# Patient Record
Sex: Male | Born: 1955 | Hispanic: Refuse to answer | Marital: Married | Smoking: Current every day smoker
Health system: Southern US, Community
[De-identification: ages and names within clinical notes are randomized; demographics above are authoritative.]

## PROBLEM LIST (undated history)

## (undated) DIAGNOSIS — K219 Gastro-esophageal reflux disease without esophagitis: Secondary | ICD-10-CM

## (undated) DIAGNOSIS — J449 Chronic obstructive pulmonary disease, unspecified: Secondary | ICD-10-CM

## (undated) DIAGNOSIS — F1021 Alcohol dependence, in remission: Secondary | ICD-10-CM

## (undated) DIAGNOSIS — J410 Simple chronic bronchitis: Secondary | ICD-10-CM

## (undated) DIAGNOSIS — Z973 Presence of spectacles and contact lenses: Secondary | ICD-10-CM

## (undated) DIAGNOSIS — C679 Malignant neoplasm of bladder, unspecified: Secondary | ICD-10-CM

## (undated) DIAGNOSIS — Z862 Personal history of diseases of the blood and blood-forming organs and certain disorders involving the immune mechanism: Secondary | ICD-10-CM

## (undated) DIAGNOSIS — D494 Neoplasm of unspecified behavior of bladder: Secondary | ICD-10-CM

## (undated) DIAGNOSIS — C6290 Malignant neoplasm of unspecified testis, unspecified whether descended or undescended: Secondary | ICD-10-CM

## (undated) DIAGNOSIS — I499 Cardiac arrhythmia, unspecified: Secondary | ICD-10-CM

## (undated) DIAGNOSIS — R011 Cardiac murmur, unspecified: Secondary | ICD-10-CM

## (undated) DIAGNOSIS — H40003 Preglaucoma, unspecified, bilateral: Secondary | ICD-10-CM

## (undated) DIAGNOSIS — Z87898 Personal history of other specified conditions: Secondary | ICD-10-CM

## (undated) HISTORY — PX: ORCHIOPEXY: SUR915

## (undated) HISTORY — PX: RADICAL ORCHIECTOMY: SHX2285

## (undated) HISTORY — PX: TESTICLE REMOVAL: SHX68

## (undated) HISTORY — PX: SURGERY SCROTAL / TESTICULAR: SUR1316

## (undated) HISTORY — DX: Cardiac arrhythmia, unspecified: I49.9

## (undated) HISTORY — PX: MOUTH SURGERY: SHX715

---

## 1982-10-11 DIAGNOSIS — Z87820 Personal history of traumatic brain injury: Secondary | ICD-10-CM

## 1982-10-11 HISTORY — DX: Personal history of traumatic brain injury: Z87.820

## 1999-10-12 DIAGNOSIS — Z9221 Personal history of antineoplastic chemotherapy: Secondary | ICD-10-CM

## 1999-10-12 DIAGNOSIS — Z8547 Personal history of malignant neoplasm of testis: Secondary | ICD-10-CM

## 1999-10-12 HISTORY — DX: Personal history of antineoplastic chemotherapy: Z92.21

## 1999-10-12 HISTORY — DX: Personal history of malignant neoplasm of testis: Z85.47

## 1999-12-06 ENCOUNTER — Ambulatory Visit (HOSPITAL_COMMUNITY): Admission: RE | Admit: 1999-12-06 | Discharge: 1999-12-06 | Payer: Self-pay | Admitting: Internal Medicine

## 1999-12-06 ENCOUNTER — Emergency Department (HOSPITAL_COMMUNITY): Admission: EM | Admit: 1999-12-06 | Discharge: 1999-12-06 | Payer: Self-pay | Admitting: Emergency Medicine

## 1999-12-06 ENCOUNTER — Encounter: Payer: Self-pay | Admitting: Internal Medicine

## 1999-12-14 ENCOUNTER — Ambulatory Visit (HOSPITAL_COMMUNITY): Admission: RE | Admit: 1999-12-14 | Discharge: 1999-12-14 | Payer: Self-pay | Admitting: *Deleted

## 1999-12-14 ENCOUNTER — Encounter: Payer: Self-pay | Admitting: *Deleted

## 1999-12-15 ENCOUNTER — Encounter: Payer: Self-pay | Admitting: Urology

## 1999-12-16 ENCOUNTER — Inpatient Hospital Stay (HOSPITAL_COMMUNITY): Admission: RE | Admit: 1999-12-16 | Discharge: 1999-12-17 | Payer: Self-pay | Admitting: Urology

## 1999-12-16 ENCOUNTER — Encounter (INDEPENDENT_AMBULATORY_CARE_PROVIDER_SITE_OTHER): Payer: Self-pay | Admitting: *Deleted

## 1999-12-22 ENCOUNTER — Encounter (HOSPITAL_COMMUNITY): Admission: RE | Admit: 1999-12-22 | Discharge: 2000-03-21 | Payer: Self-pay | Admitting: Dentistry

## 1999-12-27 ENCOUNTER — Inpatient Hospital Stay (HOSPITAL_COMMUNITY): Admission: RE | Admit: 1999-12-27 | Discharge: 2000-01-02 | Payer: Self-pay | Admitting: General Surgery

## 1999-12-28 ENCOUNTER — Encounter (HOSPITAL_BASED_OUTPATIENT_CLINIC_OR_DEPARTMENT_OTHER): Payer: Self-pay | Admitting: General Surgery

## 1999-12-28 ENCOUNTER — Encounter: Payer: Self-pay | Admitting: *Deleted

## 2000-01-17 ENCOUNTER — Inpatient Hospital Stay (HOSPITAL_COMMUNITY): Admission: EM | Admit: 2000-01-17 | Discharge: 2000-01-22 | Payer: Self-pay | Admitting: *Deleted

## 2000-02-07 ENCOUNTER — Inpatient Hospital Stay (HOSPITAL_COMMUNITY): Admission: AD | Admit: 2000-02-07 | Discharge: 2000-02-12 | Payer: Self-pay | Admitting: *Deleted

## 2000-03-15 ENCOUNTER — Ambulatory Visit (HOSPITAL_COMMUNITY): Admission: RE | Admit: 2000-03-15 | Discharge: 2000-03-15 | Payer: Self-pay | Admitting: General Surgery

## 2000-03-28 ENCOUNTER — Ambulatory Visit (HOSPITAL_COMMUNITY): Admission: RE | Admit: 2000-03-28 | Discharge: 2000-03-28 | Payer: Self-pay | Admitting: *Deleted

## 2000-03-28 ENCOUNTER — Encounter: Payer: Self-pay | Admitting: *Deleted

## 2000-04-26 ENCOUNTER — Encounter: Payer: Self-pay | Admitting: *Deleted

## 2000-04-26 ENCOUNTER — Encounter: Admission: RE | Admit: 2000-04-26 | Discharge: 2000-04-26 | Payer: Self-pay | Admitting: *Deleted

## 2000-05-11 ENCOUNTER — Encounter: Payer: Self-pay | Admitting: *Deleted

## 2000-05-11 ENCOUNTER — Ambulatory Visit (HOSPITAL_COMMUNITY): Admission: RE | Admit: 2000-05-11 | Discharge: 2000-05-11 | Payer: Self-pay | Admitting: *Deleted

## 2000-06-22 ENCOUNTER — Encounter: Admission: RE | Admit: 2000-06-22 | Discharge: 2000-06-22 | Payer: Self-pay | Admitting: Urology

## 2000-06-22 ENCOUNTER — Encounter: Payer: Self-pay | Admitting: Urology

## 2000-07-04 ENCOUNTER — Encounter: Payer: Self-pay | Admitting: *Deleted

## 2000-07-04 ENCOUNTER — Encounter: Admission: RE | Admit: 2000-07-04 | Discharge: 2000-07-04 | Payer: Self-pay | Admitting: *Deleted

## 2000-08-01 ENCOUNTER — Encounter: Payer: Self-pay | Admitting: *Deleted

## 2000-08-01 ENCOUNTER — Ambulatory Visit (HOSPITAL_COMMUNITY): Admission: RE | Admit: 2000-08-01 | Discharge: 2000-08-01 | Payer: Self-pay | Admitting: *Deleted

## 2000-10-13 ENCOUNTER — Encounter: Payer: Self-pay | Admitting: *Deleted

## 2000-10-13 ENCOUNTER — Ambulatory Visit (HOSPITAL_COMMUNITY): Admission: RE | Admit: 2000-10-13 | Discharge: 2000-10-13 | Payer: Self-pay | Admitting: *Deleted

## 2000-12-15 ENCOUNTER — Encounter: Payer: Self-pay | Admitting: *Deleted

## 2000-12-15 ENCOUNTER — Encounter: Admission: RE | Admit: 2000-12-15 | Discharge: 2000-12-15 | Payer: Self-pay | Admitting: *Deleted

## 2001-01-17 ENCOUNTER — Ambulatory Visit (HOSPITAL_COMMUNITY): Admission: RE | Admit: 2001-01-17 | Discharge: 2001-01-17 | Payer: Self-pay | Admitting: *Deleted

## 2001-01-17 ENCOUNTER — Encounter: Payer: Self-pay | Admitting: *Deleted

## 2001-02-17 ENCOUNTER — Encounter: Admission: RE | Admit: 2001-02-17 | Discharge: 2001-02-17 | Payer: Self-pay | Admitting: *Deleted

## 2001-02-17 ENCOUNTER — Encounter: Payer: Self-pay | Admitting: *Deleted

## 2001-07-03 ENCOUNTER — Ambulatory Visit (HOSPITAL_COMMUNITY): Admission: RE | Admit: 2001-07-03 | Discharge: 2001-07-03 | Payer: Self-pay | Admitting: Oncology

## 2001-07-03 ENCOUNTER — Encounter: Payer: Self-pay | Admitting: Oncology

## 2001-10-05 ENCOUNTER — Encounter: Payer: Self-pay | Admitting: Urology

## 2001-10-05 ENCOUNTER — Ambulatory Visit (HOSPITAL_COMMUNITY): Admission: RE | Admit: 2001-10-05 | Discharge: 2001-10-05 | Payer: Self-pay | Admitting: Urology

## 2003-04-19 ENCOUNTER — Encounter: Payer: Self-pay | Admitting: Oncology

## 2003-04-19 ENCOUNTER — Encounter: Admission: RE | Admit: 2003-04-19 | Discharge: 2003-04-19 | Payer: Self-pay | Admitting: Oncology

## 2004-03-30 ENCOUNTER — Encounter: Admission: RE | Admit: 2004-03-30 | Discharge: 2004-03-30 | Payer: Self-pay | Admitting: Oncology

## 2004-08-22 ENCOUNTER — Ambulatory Visit: Payer: Self-pay | Admitting: Oncology

## 2005-02-19 ENCOUNTER — Ambulatory Visit: Payer: Self-pay | Admitting: Oncology

## 2005-02-22 ENCOUNTER — Ambulatory Visit (HOSPITAL_COMMUNITY): Admission: RE | Admit: 2005-02-22 | Discharge: 2005-02-22 | Payer: Self-pay | Admitting: Oncology

## 2005-02-22 ENCOUNTER — Ambulatory Visit: Payer: Self-pay | Admitting: Oncology

## 2007-02-23 ENCOUNTER — Ambulatory Visit: Payer: Self-pay | Admitting: Oncology

## 2007-02-27 ENCOUNTER — Ambulatory Visit (HOSPITAL_COMMUNITY): Admission: RE | Admit: 2007-02-27 | Discharge: 2007-02-27 | Payer: Self-pay | Admitting: Oncology

## 2007-03-01 LAB — BETA HCG QUANT (REF LAB): Beta hCG, Tumor Marker: <0.5 m[IU]/mL (ref <5.0)

## 2008-02-22 ENCOUNTER — Ambulatory Visit: Payer: Self-pay | Admitting: Oncology

## 2008-04-24 ENCOUNTER — Ambulatory Visit: Payer: Self-pay | Admitting: Oncology

## 2008-05-01 LAB — BETA HCG QUANT (REF LAB): Beta hCG, Tumor Marker: <0.5 m[IU]/mL (ref <5.0)

## 2009-05-23 ENCOUNTER — Ambulatory Visit: Payer: Self-pay | Admitting: Oncology

## 2009-05-29 LAB — BETA HCG QUANT (REF LAB): Beta hCG, Tumor Marker: <0.5 m[IU]/mL (ref <5.0)

## 2009-08-22 ENCOUNTER — Ambulatory Visit: Payer: Self-pay | Admitting: Oncology

## 2010-01-23 ENCOUNTER — Ambulatory Visit: Payer: Self-pay | Admitting: Oncology

## 2010-11-02 ENCOUNTER — Encounter: Payer: Self-pay | Admitting: Oncology

## 2011-02-04 ENCOUNTER — Other Ambulatory Visit: Payer: Self-pay | Admitting: Oncology

## 2011-02-04 ENCOUNTER — Encounter (HOSPITAL_BASED_OUTPATIENT_CLINIC_OR_DEPARTMENT_OTHER): Payer: Self-pay | Admitting: Oncology

## 2011-02-04 DIAGNOSIS — Q539 Undescended testicle, unspecified: Secondary | ICD-10-CM

## 2011-02-04 DIAGNOSIS — R55 Syncope and collapse: Secondary | ICD-10-CM

## 2011-02-04 DIAGNOSIS — C6290 Malignant neoplasm of unspecified testis, unspecified whether descended or undescended: Secondary | ICD-10-CM

## 2011-02-04 DIAGNOSIS — R031 Nonspecific low blood-pressure reading: Secondary | ICD-10-CM
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
## 2011-02-26 NOTE — Op Note (Signed)
[place]  Patient:    [name], Jonathan Ray                       MRN: [id number] Proc. Date: 12/28/99 Adm. Date:  [id number] Attending:  Craig Staggers CC:         Mardene Celeste Lurene Shadow, M.D. (2)                           Operative Report  PREOPERATIVE DIAGNOSIS:  Carcinoma of the testicle and poor venous access.  POSTOPERATIVE DIAGNOSIS:  Carcinoma of the testicle and poor venous access.  OPERATION PERFORMED:  Port-A-Cath implantation to facilitate chemotherapy.  SURGEON:  Mardene Celeste. Lurene Shadow, M.D.  ASSISTANT:  Nurse.  ANESTHESIA:  MAC.  I used 1% Xylocaine with epinephrine.  INDICATIONS FOR PROCEDURE:   The patient is a 55 year old man who was brought to the operating room for implantation of a Port-A-Cath to facilitate chemotherapy for testicular carcinoma.  He is otherwise in excellent health.  DESCRIPTION OF PROCEDURE:  Following induction of satisfactory sedation, the patient was placed in Trendelenburg position.  The anterior neck and chest were  prepped and draped to be included in a sterile operative field.  A subclavian stick made in the right subclavian vein and this was carried out and a guide wire inserted and manipulated into the superior vena cava.  The pocket was then created on the anterior chest wall and a tunnel from the pocket up to the shoulder wound. At the point of which we tried to insert the dilator introducer.  The dilator would not progress because of its very close proximity to the clavicle on the left appears to be a cervical rib.  This approach was then abandoned and we then went up to the internal jugular where the internal jugular vein was accessed and a guide wire threaded into the central venous system under fluoroscopic guidance.  The Silastic catheter was then threaded up above the clavicle to the jugular vein. The introducer and dilator passed down into the superior vena cava without difficulty. The  dilator was removed and the Silastic catheter then threaded into the central venous system and positioned at the atrial vena caval junction.  Inflow of heparinized saline  and blood return were noted to be excellent.  The external portion of the catheter was then attached to the reservoir and inflow of heparinized saline and blood return through the reservoir were noted to be excellent.  The reservoir was then implanted into the pocket and sutured with 2-0 silk sutures.  Final fluoroscopic evaluation of the implantation showed no unusual kinks bends or turns within the course of the Silastic catheter and inflow of heparinized saline and blood return were all noted to be excellent.  Sponge, instrument and sharp counts were verified and all wounds were closed in layers s follows.  The neck wound, shoulder wound and pocket wound were closed in two layers with interrupted Vicryl sutures in the subcutaneous tissues and 4-0 Monocryl sutures of the skin.  All wounds were then reinforced with Steri-Strips. Sterile dressings applied.  Anesthetic reversed.  Patient removed from the operating room to the recovery room in stable condition having tolerated the procedure well. DD:  12/28/99 TD:  12/28/99 Job: [id number]

## 2011-02-26 NOTE — Discharge Summary (Signed)
[place]. [facility]  Patient:    Jonathan Ray, Jonathan Ray                       MRN: [id number] Proc. Date: 02/12/00 Adm. Date:  [id number] Disc. Date: [id number] Attending:  Craig Staggers CC:         Bertram Millard. Dahlstedt, M.D.             Mardene Celeste Lurene Shadow, M.D.                           Discharge Summary  DATE OF BIRTH:  [birth year]  DIAGNOSES: 1. 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 2. Anemia secondary to cancer and chemotherapy. 3. Protein malnutrition secondary to cancer and chemotherapy. 4. Minimal edema, right upper extremity, suspect secondary to Port-A-Cath. 5. Port-A-Cath, right subclavian.  FOLLOWUP:  Followup appointment, May 8 and May 15 in the office of Dr. Nevada Crane for chemotherapy with bleomycin.  CONDITION: Stable.  PROCEDURES: None.  CONSULTATIONS: None.  COMPLICATIONS: None.  DISPOSITION: Home.  SPECIAL INSTRUCTIONS: Neutropenic precautions.  Call with any problems.  HISTORY OF PRESENT ILLNESS:  Jonathan Ray is a 55 year old man.  He had bilateral undescended testes.  He underwent reparative surgery at age 27 for the right testicle and at age 32 for the left one.  He developed a seminoma in the left testicle and it was stage II.  The primary tumor was resected.  His lymph nodes were up to 6 cm in the pelvis.  He had Ray lymph nodes above the ______.  He received two courses of BEP with minimal side effects and was admitted electively on April 29 for is third and last course of BEP.  He was given G-CSF for a few days prior to admission for the neutrophils to be more than 1000 (during the previous cycle his neutrophils were below 1000 on day #1).  HOSPITAL COURSE: On admission he was in Ray distress.  Blood pressure 120/70, heart rate 69, respirations 17, temperature 98.  There was Ray mucocitis.  His chest was clear.  Heart was regular.   Abdomen is soft, benign.  The midline scar looked normal.  There was Ray organomegaly.  Ray masses.  His laboratory results indicated a beta hCG less than 2 on February 08, 2000. His white count was 26.7, hemoglobin 11.6, platelets 136.  Sodium 139, BUN 12, creatinine 0.8, albumin 3.1.  LDH 321.  ALT 16, AST 18.  He was admitted to a regular nursing floor and chemotherapy with bleomycin, etoposide, cisplatin was instituted.  The patient tolerated chemotherapy well with minimal nausea on the last day and the metallic taste.  He had Ray other side effects.  Of note, minimal edema of the right upper extremity was noticed on admission. Again, it was minimal and collateral circulation did not progress and it was attributed possibly to impingement on the venous return by the Port-A-Cath.  The plan is to restage Jonathan Ray with a CAT scan of his chest, abdomen and pelvis around three weeks after commencement of his last course, and should he have Ray evidence of disease, the Port-A-Cath will be removed.  At any rate, Jonathan Ray will follow up in my office on May 8 and also on May 15.  Regarding his present ______ that he is also at risk for ______  and needs to be followed with periodic ultrasound or alternatively orchiectomy. The patient was seen by Dr. Retta Diones and the ______ decision was made for periodic followup with ultrasound. DD:  02/12/00 TD:  02/16/00 Job: [id number]

## 2011-03-04 ENCOUNTER — Ambulatory Visit (HOSPITAL_COMMUNITY)
Admission: RE | Admit: 2011-03-04 | Discharge: 2011-03-04 | Disposition: A | Discharge status: Home / Self Care | Payer: Self-pay | Source: Ambulatory Visit | Attending: Oncology | Admitting: Oncology

## 2011-03-04 ENCOUNTER — Other Ambulatory Visit: Payer: Self-pay | Admitting: Oncology

## 2011-03-04 DIAGNOSIS — C6290 Malignant neoplasm of unspecified testis, unspecified whether descended or undescended: Secondary | ICD-10-CM

## 2011-03-04 DIAGNOSIS — F17200 Nicotine dependence, unspecified, uncomplicated: Secondary | ICD-10-CM | POA: Insufficient documentation

## 2011-03-04 DIAGNOSIS — Q539 Undescended testicle, unspecified: Secondary | ICD-10-CM | POA: Insufficient documentation

## 2011-03-04 DIAGNOSIS — R059 Cough, unspecified: Secondary | ICD-10-CM | POA: Insufficient documentation

## 2011-03-04 DIAGNOSIS — Z8547 Personal history of malignant neoplasm of testis: Secondary | ICD-10-CM | POA: Insufficient documentation

## 2011-03-04 DIAGNOSIS — R05 Cough: Secondary | ICD-10-CM | POA: Insufficient documentation

## 2011-03-31 ENCOUNTER — Other Ambulatory Visit: Payer: Self-pay | Admitting: Oncology

## 2011-03-31 DIAGNOSIS — C6290 Malignant neoplasm of unspecified testis, unspecified whether descended or undescended: Secondary | ICD-10-CM

## 2011-08-20 ENCOUNTER — Other Ambulatory Visit (HOSPITAL_COMMUNITY): Payer: Self-pay

## 2011-08-28 ENCOUNTER — Telehealth: Payer: Self-pay | Admitting: Oncology

## 2011-08-28 NOTE — Telephone Encounter (Signed)
Mailed the pt his April 2013 appt calendar

## 2012-02-03 ENCOUNTER — Ambulatory Visit: Payer: Self-pay | Admitting: Oncology

## 2012-02-03 ENCOUNTER — Other Ambulatory Visit: Payer: Self-pay | Admitting: *Deleted

## 2016-01-14 ENCOUNTER — Ambulatory Visit (INDEPENDENT_AMBULATORY_CARE_PROVIDER_SITE_OTHER): Payer: Self-pay | Admitting: Urgent Care

## 2016-01-14 VITALS — BP 132/82 | HR 70 | Temp 97.8°F | Resp 17 | Ht 74.0 in | Wt 187.0 lb

## 2016-01-14 DIAGNOSIS — Z021 Encounter for pre-employment examination: Secondary | ICD-10-CM

## 2016-01-14 DIAGNOSIS — Z024 Encounter for examination for driving license: Secondary | ICD-10-CM

## 2016-01-14 NOTE — Patient Instructions (Signed)
     IF you received an x-ray today, you will receive an invoice from [facility]. Please contact [facility] at [phone] with questions or concerns regarding your invoice.   IF you received labwork today, you will receive an invoice from [facility]. Please contact [facility] at [phone] with questions or concerns regarding your invoice.   Our billing staff will not be able to assist you with questions regarding bills from these companies.  You will be contacted with the lab results as soon as they are available. The fastest way to get your results is to activate your My Chart account. Instructions are located on the last page of this paperwork. If you have not heard from us regarding the results in 2 weeks, please contact this office.      

## 2016-01-14 NOTE — Progress Notes (Signed)
Commercial Driver Medical Examination   Jonathan Ray is a 60 y.o. male who presents today for a DOT physical exam. The patient reports smoking 1-2ppd for the past 40 years. Denies chest pain, shob, wheezing. Had gum disease surgically repaired, tooth extraction 06-21-2015, without sequelae. He also has a history of testicular cancer last treated in 2001. Had undescended testicles surgically corrected 1962, 1964.   The following portions of the patient's history were reviewed and updated as appropriate: allergies, current medications, past family history, past medical history, past social history and past surgical history.  Objective:   BP 132/82 mmHg  Pulse 70  Temp(Src) 97.8 F (36.6 C) (Oral)  Resp 17  Ht 6\' 2"  (1.88 m)  Wt 187 lb (84.823 kg)  BMI 24.00 kg/m2  SpO2 95%  Vision/hearing:  Visual Acuity Screening   Right eye Left eye Both eyes  Without correction:     With correction: 20/20 20/20 20/20   Comments: Peripheral Vision: Right eye 85 degrees. Left eye 85 degrees.The patient can distinguish the colors red, amber and green.  Hearing Screening Comments: The patient was able to hear a forced whisper from 10 feet.  Applicant can recognize and distinguish among traffic control signals and devices showing standard red, green, and amber colors.  Corrective lenses required: Yes  Monocular Vision?: No  Hearing aid requirement: No  Physical Exam  Constitutional: He is oriented to person, place, and time. He appears well-developed and well-nourished.  HENT:  TM's intact bilaterally, no effusions or erythema. Nasal turbinates pink and moist, nasal passages patent. No sinus tenderness. Dentition is in poor condition, mild plaque, mucous membranes moist, dentition in good repair.  Eyes: Conjunctivae and EOM are normal. Pupils are equal, round, and reactive to light. Right eye exhibits no discharge. Left eye exhibits no discharge. No scleral icterus.  Neck: Normal range of  motion. Neck supple. No thyromegaly present.  Cardiovascular: Normal rate, regular rhythm and intact distal pulses.  Exam reveals no gallop and no friction rub.   No murmur heard. Pulmonary/Chest: No stridor. No respiratory distress. He has no wheezes. He has no rales.  Abdominal: Soft. Bowel sounds are normal. He exhibits no distension and no mass. There is no tenderness.  Musculoskeletal: Normal range of motion. He exhibits no edema or tenderness.  Lymphadenopathy:    He has no cervical adenopathy.  Neurological: He is alert and oriented to person, place, and time.  Skin: Skin is warm and dry. No rash noted. No erythema. No pallor.  Psychiatric: He has a normal mood and affect.   Labs: Comments: u/a pro neg glu neg blood trace sp gr 1.005  Assessment:    Healthy male exam.  Meets standards in 15 CFR 391.41;  qualifies for 2 year certificate.    Plan:    Medical examiners certificate completed and printed. Return as needed.

## 2016-01-15 ENCOUNTER — Encounter (HOSPITAL_COMMUNITY): Payer: Self-pay | Admitting: Emergency Medicine

## 2016-01-15 ENCOUNTER — Emergency Department (HOSPITAL_COMMUNITY)
Admission: EM | Admit: 2016-01-15 | Discharge: 2016-01-15 | Disposition: A | Discharge status: Home / Self Care | Payer: Self-pay | Attending: Emergency Medicine | Admitting: Emergency Medicine

## 2016-01-15 ENCOUNTER — Emergency Department (HOSPITAL_COMMUNITY): Payer: Self-pay

## 2016-01-15 DIAGNOSIS — R011 Cardiac murmur, unspecified: Secondary | ICD-10-CM | POA: Insufficient documentation

## 2016-01-15 DIAGNOSIS — F17210 Nicotine dependence, cigarettes, uncomplicated: Secondary | ICD-10-CM | POA: Insufficient documentation

## 2016-01-15 DIAGNOSIS — Z8547 Personal history of malignant neoplasm of testis: Secondary | ICD-10-CM | POA: Insufficient documentation

## 2016-01-15 DIAGNOSIS — R0602 Shortness of breath: Secondary | ICD-10-CM | POA: Insufficient documentation

## 2016-01-15 DIAGNOSIS — R002 Palpitations: Secondary | ICD-10-CM | POA: Insufficient documentation

## 2016-01-15 DIAGNOSIS — Z880 Allergy status to penicillin: Secondary | ICD-10-CM | POA: Insufficient documentation

## 2016-01-15 DIAGNOSIS — R0789 Other chest pain: Secondary | ICD-10-CM | POA: Insufficient documentation

## 2016-01-15 HISTORY — DX: Malignant neoplasm of unspecified testis, unspecified whether descended or undescended: C62.90

## 2016-01-15 HISTORY — DX: Cardiac murmur, unspecified: R01.1

## 2016-01-15 LAB — BASIC METABOLIC PANEL
Anion gap: 7 (ref 5–15)
BUN: 10 mg/dL (ref 6–20)
CO2: 26 mmol/L (ref 22–32)
Calcium: 8.8 mg/dL — ABNORMAL LOW (ref 8.9–10.3)
Chloride: 110 mmol/L (ref 101–111)
Creatinine, Ser: 0.78 mg/dL (ref 0.61–1.24)
GFR calc Af Amer: >60 mL/min (ref >60)
Glucose, Bld: 112 mg/dL — ABNORMAL HIGH (ref 65–99)
POTASSIUM: 3.7 mmol/L (ref 3.5–5.1)
SODIUM: 143 mmol/L (ref 135–145)

## 2016-01-15 LAB — CBC WITH DIFFERENTIAL/PLATELET
BASOS ABS: 0 10*3/uL (ref 0.0–0.1)
BASOS PCT: 0 %
EOS ABS: 0.2 10*3/uL (ref 0.0–0.7)
EOS PCT: 3 %
HCT: 42.4 % (ref 39.0–52.0)
Hemoglobin: 15 g/dL (ref 13.0–17.0)
Lymphocytes Relative: 41 %
Lymphs Abs: 2.1 10*3/uL (ref 0.7–4.0)
MCH: 31.6 pg (ref 26.0–34.0)
MCHC: 35.4 g/dL (ref 30.0–36.0)
MCV: 89.3 fL (ref 78.0–100.0)
Monocytes Absolute: 0.5 10*3/uL (ref 0.1–1.0)
Monocytes Relative: 10 %
Neutro Abs: 2.3 10*3/uL (ref 1.7–7.7)
Neutrophils Relative %: 46 %
PLATELETS: 195 10*3/uL (ref 150–400)
RBC: 4.75 MIL/uL (ref 4.22–5.81)
RDW: 12.6 % (ref 11.5–15.5)
WBC: 5.1 10*3/uL (ref 4.0–10.5)

## 2016-01-15 LAB — TSH: TSH: 0.441 u[IU]/mL (ref 0.350–4.500)

## 2016-01-15 LAB — TROPONIN I

## 2016-01-15 NOTE — Discharge Instructions (Signed)

## 2016-01-15 NOTE — ED Provider Notes (Signed)
CSN: [id number]     Arrival date & time 01/15/16  0222 History  By signing my name below, I, Altamease Oiler, attest that this documentation has been prepared under the direction and in the presence of Orpah Greek, MD. Electronically Signed: Altamease Oiler, ED Scribe. 01/15/2016. 3:30 AM    Chief Complaint  Patient presents with  . Irregular Heart Beat   The history is provided by the patient. No language interpreter was used.   Jonathan Ray is a 60 y.o. male who presents to the Emergency Department complaining of an episode of palpitations with onset last night near 10 PM. Pt states that he has had intermittent arrhythmias for most of his life but this episode lasted for longer than usual. He states that they episodes typically last for a few minutes but this one lasted for nearly 4 hours before resolving. His heart rate was near 110 BPM at home. Associated symptoms include SOB during the episode and mild chest tightness that lingered for a while after the episode but is not currently present. He has similar chest tightness with rigorous hiking that he has associated with being deconditioned in the past.  Past Medical History  Diagnosis Date  . Heart murmur   . Testicular cancer [facility])    Past Surgical History  Procedure Laterality Date  . Testicle removal    . Mouth surgery    . Surgery scrotal / testicular     Family History  Problem Relation Age of Onset  . Cancer Other    Social History  Substance Use Topics  . Smoking status: Current Every Day Smoker -- 2.00 packs/day for 40 years    Types: Cigarettes  . Smokeless tobacco: None  . Alcohol Use: No     Comment: former     Review of Systems  Respiratory: Positive for chest tightness and shortness of breath.   Cardiovascular: Positive for palpitations.  All other systems reviewed and are negative.  Allergies  Penicillins  Home Medications   Prior to Admission medications   Not on File   BP 128/86 mmHg   Pulse 80  Temp(Src) 97.4 F (36.3 C) (Oral)  Resp 14  Ht 6\' 1"  (1.854 m)  Wt 187 lb (84.823 kg)  BMI 24.68 kg/m2  SpO2 100% Physical Exam  Constitutional: He is oriented to person, place, and time. He appears well-developed and well-nourished. No distress.  HENT:  Head: Normocephalic and atraumatic.  Right Ear: Hearing normal.  Left Ear: Hearing normal.  Nose: Nose normal.  Mouth/Throat: Oropharynx is clear and moist and mucous membranes are normal.  Eyes: Conjunctivae and EOM are normal. Pupils are equal, round, and reactive to light.  Neck: Normal range of motion. Neck supple.  Cardiovascular: Regular rhythm, S1 normal and S2 normal.  Exam reveals no gallop and no friction rub.   No murmur heard. Pulmonary/Chest: Effort normal and breath sounds normal. No respiratory distress. He exhibits no tenderness.  Abdominal: Soft. Normal appearance and bowel sounds are normal. There is no hepatosplenomegaly. There is no tenderness. There is no rebound, no guarding, no tenderness at McBurney's point and negative Murphy's sign. No hernia.  Musculoskeletal: Normal range of motion.  Neurological: He is alert and oriented to person, place, and time. He has normal strength. No cranial nerve deficit or sensory deficit. Coordination normal. GCS eye subscore is 4. GCS verbal subscore is 5. GCS motor subscore is 6.  Skin: Skin is warm, dry and intact. No rash noted. No cyanosis.  Psychiatric: He has a normal mood and affect. His speech is normal and behavior is normal. Thought content normal.  Nursing note and vitals reviewed.   ED Course  Procedures (including critical care time) DIAGNOSTIC STUDIES: Oxygen Saturation is 100% on RA,  normal by my interpretation.    COORDINATION OF CARE: 3:16 AM Discussed treatment plan which includes lab work, CXR, EKG with pt at bedside and pt agreed to plan.  Labs Review Labs Reviewed  CBC WITH DIFFERENTIAL/PLATELET  BASIC METABOLIC PANEL  TROPONIN I  TSH     Imaging Review No results found. I have personally reviewed and evaluated these images and lab results as part of my medical decision-making.   EKG Interpretation   Date/Time:  Thursday January 15 2016 02:39:18 EDT Ventricular Rate:  78 PR Interval:  177 QRS Duration: 96 QT Interval:  372 QTC Calculation: 424 R Axis:   33 Text Interpretation:  Sinus rhythm Probable left atrial enlargement No  significant change since last tracing Confirmed by POLLINA  MD,  CHRISTOPHER [phone]) on 01/15/2016 2:47:23 AM      MDM   Final diagnoses:  None  Heart palpitations  Presents to the emergency department for evaluation of palpitations. Patient reports that he has had palpitations frequently for most of his life. Usually they come on suddenly and then go away fairly quickly. He reports that he had palpitations tonight that were ongoing for possibly 4 hours. He had mild discomfort in his chest and shortness of breath associated with the symptoms. He reports that he is heart rate was around 110 when it was happening.  Resolved prior to arrival in the ER. He has not had any recurrence of symptoms. No arrhythmia is noted here in the ER. Vital signs are normal. Workup unremarkable. Patient will be referred to cardiology for further management and workup for intermittent palpitations and possible arrhythmia.  I personally performed the services described in this documentation, which was scribed in my presence. The recorded information has been reviewed and is accurate.    Orpah Greek, MD 01/15/16 [phone]

## 2016-01-15 NOTE — ED Notes (Signed)
Pt states he has had palpitations, heart murmur and arrythmias most of his life but tonight around 2200 his heart started beating fast and irregular and that lasted until about a half hour ago  Pt states he had some shortness of breath associated with it and his chest felt tight but he never really had any pain

## 2016-02-12 DIAGNOSIS — R011 Cardiac murmur, unspecified: Secondary | ICD-10-CM

## 2016-02-12 DIAGNOSIS — I499 Cardiac arrhythmia, unspecified: Secondary | ICD-10-CM

## 2016-02-12 DIAGNOSIS — C6290 Malignant neoplasm of unspecified testis, unspecified whether descended or undescended: Secondary | ICD-10-CM

## 2016-02-15 NOTE — Progress Notes (Signed)
Patient ID: Jonathan Ray, male   DOB: [birth year], 60 y.o.   MRN: [id number]     Cardiology Office Note   Date:  02/16/2016   ID:  Jonathan Ray, DOB Jul 15, 1956, MRN [id number]  PCP:  No PCP Per Patient  Cardiologist:   Jenkins Rouge, MD   Chief Complaint  Patient presents with  . Establish Care    no sx      History of Present Illness: Jonathan Ray is a 59 y.o. male who presents for evaluation of palpitations  Seen in ER 01/15/16  Onset  10 PM. Pt states that he has had intermittent arrhythmias for most of his life but this episode lasted for longer than usual. He states that they episodes typically last for a few minutes but this one lasted for nearly 4 hours before resolving. His heart rate was near 110 BPM at home. Associated symptoms include SOB during the episode and mild chest tightness that lingered for a while after the episode but is not currently present. He has similar chest tightness with rigorous hiking that he has associated with being deconditioned in the past.  No arrhythmia seen in ER and d/c home  Troponin , TSH and other labs normal.      Past Medical History  Diagnosis Date  . Heart murmur   . Testicular cancer ([place])   . Irregular heartbeat     Past Surgical History  Procedure Laterality Date  . Testicle removal    . Mouth surgery    . Surgery scrotal / testicular       No current outpatient prescriptions on file.   No current facility-administered medications for this visit.    Allergies:   Penicillins    Social History:  The patient  reports that he has been smoking Cigarettes.  He has a 80 pack-year smoking history. He has never used smokeless tobacco. He reports that he does not drink alcohol or use illicit drugs.   Family History:  The patient's family history includes Cancer in his other; Healthy in his mother; Other in his father.    ROS:  Please see the history of present illness.   Otherwise, review of systems are positive for none.   All  other systems are reviewed and negative.    PHYSICAL EXAM: VS:  BP 120/62 mmHg  Pulse 74  Ht 6\' 1"  (1.854 m)  Wt 86.818 kg (191 lb 6.4 oz)  BMI 25.26 kg/m2  SpO2 95% , BMI Body mass index is 25.26 kg/(m^2). Affect appropriate Healthy:  appears stated age 70: normal Neck supple with no adenopathy JVP normal no bruits no thyromegaly Lungs clear with no wheezing and good diaphragmatic motion Heart:  S1/S2 no murmur, no rub, gallop or click PMI normal Abdomen: benighn, BS positve, no tenderness, no AAA no bruit.  No HSM or HJR Distal pulses intact with no bruits No edema Neuro non-focal Skin warm and dry No muscular weakness    EKG:  NSR rate 78 normal   Recent Labs: 01/15/2016: BUN 10; Creatinine, Ser 0.78; Hemoglobin 15.0; Platelets 195; Potassium 3.7; Sodium 143; TSH 0.441    Lipid Panel No results found for: CHOL, TRIG, HDL, CHOLHDL, VLDL, LDLCALC, LDLDIRECT    Wt Readings from Last 3 Encounters:  02/16/16 86.818 kg (191 lb 6.4 oz)  01/15/16 84.823 kg (187 lb)  01/14/16 84.823 kg (187 lb)      Other studies Reviewed: Additional studies/ records that were reviewed today include: ER records ,  labs and ECG.    ASSESSMENT AND PLAN:  1.  Palpitations benign sounding not frequent enough for monitor observe negative ER visit  2. Chest Pain atypical normal ECG f/u ETT 3. Dyspnea may be related to smoking CXR ok f/u echo  4. Smoking  CXR 01/15/16 NAD counseled on cessation and relationship of nicotine to palpitations , vascular disease and cancer    Current medicines are reviewed at length with the patient today.  The patient does not have concerns regarding medicines.  The following changes have been made:  no change  Labs/ tests ordered today include: Echo and ETT  No orders of the defined types were placed in this encounter.     Disposition:   FU with me PRN     Signed, Jenkins Rouge, MD  02/16/2016 10:21 AM    [facility] [place], [place], [place]  [address] Phone: [phone]; Fax: [phone]

## 2016-02-16 ENCOUNTER — Encounter: Payer: Self-pay | Admitting: Cardiovascular Disease

## 2016-02-16 ENCOUNTER — Ambulatory Visit (INDEPENDENT_AMBULATORY_CARE_PROVIDER_SITE_OTHER): Payer: PRIVATE HEALTH INSURANCE | Admitting: Cardiovascular Disease

## 2016-02-16 VITALS — BP 120/62 | HR 74 | Ht 73.0 in | Wt 191.4 lb

## 2016-02-16 DIAGNOSIS — Z7189 Other specified counseling: Secondary | ICD-10-CM | POA: Diagnosis not present

## 2016-02-16 DIAGNOSIS — Z7689 Persons encountering health services in other specified circumstances: Secondary | ICD-10-CM

## 2016-02-16 DIAGNOSIS — Z8679 Personal history of other diseases of the circulatory system: Secondary | ICD-10-CM

## 2016-02-16 DIAGNOSIS — R002 Palpitations: Secondary | ICD-10-CM | POA: Diagnosis not present

## 2016-02-16 NOTE — Patient Instructions (Signed)
Medication Instructions:  Your physician recommends that you continue on your current medications as directed. Please refer to the Current Medication list given to you today.  Labwork: NONE  Testing/Procedures: Your physician has requested that you have an exercise tolerance test. For further information please visit www.[email or web address]. Please also follow instruction sheet, as given.  Your physician has requested that you have an echocardiogram. Echocardiography is a painless test that uses sound waves to create images of your heart. It provides your doctor with information about the size and shape of your heart and how well your heart's chambers and valves are working. This procedure takes approximately one hour. There are no restrictions for this procedure.   Follow-Up: Your physician wants you to follow-up as needed with Dr. Nishan.    If you need a refill on your cardiac medications before your next appointment, please call your pharmacy.    

## 2016-02-26 ENCOUNTER — Ambulatory Visit (HOSPITAL_COMMUNITY): Payer: PRIVATE HEALTH INSURANCE | Attending: Cardiology

## 2016-02-26 ENCOUNTER — Other Ambulatory Visit: Payer: Self-pay

## 2016-02-26 ENCOUNTER — Ambulatory Visit (INDEPENDENT_AMBULATORY_CARE_PROVIDER_SITE_OTHER): Payer: PRIVATE HEALTH INSURANCE

## 2016-02-26 DIAGNOSIS — Z8679 Personal history of other diseases of the circulatory system: Secondary | ICD-10-CM

## 2016-02-26 DIAGNOSIS — I340 Nonrheumatic mitral (valve) insufficiency: Secondary | ICD-10-CM | POA: Insufficient documentation

## 2016-02-26 DIAGNOSIS — Z7189 Other specified counseling: Secondary | ICD-10-CM

## 2016-02-26 DIAGNOSIS — Z7689 Persons encountering health services in other specified circumstances: Secondary | ICD-10-CM

## 2016-02-26 DIAGNOSIS — R002 Palpitations: Secondary | ICD-10-CM | POA: Insufficient documentation

## 2016-02-26 LAB — EXERCISE TOLERANCE TEST
CHL CUP STRESS STAGE 1 GRADE: 0 %
CHL CUP STRESS STAGE 1 SBP: 123 mmHg
CHL CUP STRESS STAGE 1 SPEED: 0 mph
CHL CUP STRESS STAGE 2 GRADE: 0 %
CHL CUP STRESS STAGE 2 HR: 72 {beats}/min
CHL CUP STRESS STAGE 2 SPEED: 1 mph
CHL CUP STRESS STAGE 3 GRADE: 0.3 %
CHL CUP STRESS STAGE 3 HR: 72 {beats}/min
CHL CUP STRESS STAGE 5 GRADE: 12 %
CHL CUP STRESS STAGE 5 SPEED: 2.5 mph
CHL CUP STRESS STAGE 6 GRADE: 14 %
CHL CUP STRESS STAGE 6 HR: 144 {beats}/min
CHL CUP STRESS STAGE 7 DBP: 69 mmHg
CHL CUP STRESS STAGE 7 SBP: 182 mmHg
CHL CUP STRESS STAGE 7 SPEED: 0 mph
CHL CUP STRESS STAGE 8 GRADE: 0 %
CHL CUP STRESS STAGE 8 SBP: 155 mmHg
CHL CUP STRESS STAGE 8 SPEED: 0 mph
CSEPHR: 88 %
CSEPPHR: 144 {beats}/min
Estimated workload: 10.1 METS
Exercise duration (min): 9 min
Exercise duration (sec): 0 s
MPHR: 162 {beats}/min
Peak BP: 190 mmHg
Percent of predicted max HR: 88 %
RPE: 16
Rest HR: 59 {beats}/min
Stage 1 DBP: 81 mmHg
Stage 1 HR: 63 {beats}/min
Stage 3 Speed: 1 mph
Stage 4 DBP: 79 mmHg
Stage 4 Grade: 10 %
Stage 4 HR: 113 {beats}/min
Stage 4 SBP: 157 mmHg
Stage 4 Speed: 1.7 mph
Stage 5 DBP: 85 mmHg
Stage 5 HR: 136 {beats}/min
Stage 5 SBP: 177 mmHg
Stage 6 DBP: 79 mmHg
Stage 6 SBP: 190 mmHg
Stage 6 Speed: 3.4 mph
Stage 7 Grade: 0 %
Stage 7 HR: 115 {beats}/min
Stage 8 DBP: 72 mmHg
Stage 8 HR: 81 {beats}/min

## 2017-01-21 ENCOUNTER — Ambulatory Visit (INDEPENDENT_AMBULATORY_CARE_PROVIDER_SITE_OTHER): Payer: BLUE CROSS/BLUE SHIELD | Admitting: Physician Assistant

## 2017-01-21 VITALS — BP 117/71 | HR 62 | Temp 98.0°F | Resp 17 | Ht 74.5 in | Wt 193.0 lb

## 2017-01-21 DIAGNOSIS — Z23 Encounter for immunization: Secondary | ICD-10-CM

## 2017-01-21 NOTE — Patient Instructions (Addendum)
   IF you received an x-ray today, you will receive an invoice from [facility]. Please contact  Radiology at [phone] with questions or concerns regarding your invoice.   IF you received labwork today, you will receive an invoice from [facility]. Please contact [facility] at [phone] with questions or concerns regarding your invoice.   Our billing staff will not be able to assist you with questions regarding bills from these companies.  You will be contacted with the lab results as soon as they are available. The fastest way to get your results is to activate your My Chart account. Instructions are located on the last page of this paperwork. If you have not heard from us regarding the results in 2 weeks, please contact this office.    Tdap Vaccine (Tetanus, Diphtheria and Pertussis): What You Need to Know 1. Why get vaccinated? Tetanus, diphtheria and pertussis are very serious diseases. Tdap vaccine can protect us from these diseases. And, Tdap vaccine given to pregnant women can protect newborn babies against pertussis. TETANUS (Lockjaw) is rare in the [place] today. It causes painful muscle tightening and stiffness, usually all over the body.  It can lead to tightening of muscles in the head and neck so you can't open your mouth, swallow, or sometimes even breathe. Tetanus kills about 1 out of 10 people who are infected even after receiving the best medical care.  DIPHTHERIA is also rare in the [place] today. It can cause a thick coating to form in the back of the throat.  It can lead to breathing problems, heart failure, paralysis, and death.  PERTUSSIS (Whooping Cough) causes severe coughing spells, which can cause difficulty breathing, vomiting and disturbed sleep.  It can also lead to weight loss, incontinence, and rib fractures. Up to 2 in 100 adolescents and 5 in 100 adults with pertussis are hospitalized or have complications, which could  include pneumonia or death.  These diseases are caused by bacteria. Diphtheria and pertussis are spread from person to person through secretions from coughing or sneezing. Tetanus enters the body through cuts, scratches, or wounds. Before vaccines, as many as 200,000 cases of diphtheria, 200,000 cases of pertussis, and hundreds of cases of tetanus, were reported in the [place] each year. Since vaccination began, reports of cases for tetanus and diphtheria have dropped by about 99% and for pertussis by about 80%. 2. Tdap vaccine Tdap vaccine can protect adolescents and adults from tetanus, diphtheria, and pertussis. One dose of Tdap is routinely given at age 11 or 12. People who did not get Tdap at that age should get it as soon as possible. Tdap is especially important for healthcare professionals and anyone having close contact with a baby younger than 12 months. Pregnant women should get a dose of Tdap during every pregnancy, to protect the newborn from pertussis. Infants are most at risk for severe, life-threatening complications from pertussis. Another vaccine, called Td, protects against tetanus and diphtheria, but not pertussis. A Td booster should be given every 10 years. Tdap may be given as one of these boosters if you have never gotten Tdap before. Tdap may also be given after a severe cut or burn to prevent tetanus infection. Your doctor or the person giving you the vaccine can give you more information. Tdap may safely be given at the same time as other vaccines. 3. Some people should not get this vaccine  A person who has ever had a life-threatening allergic reaction after a previous   dose of any diphtheria, tetanus or pertussis containing vaccine, OR has a severe allergy to any part of this vaccine, should not get Tdap vaccine. Tell the person giving the vaccine about any severe allergies.  Anyone who had coma or long repeated seizures within 7 days after a childhood dose of DTP or  DTaP, or a previous dose of Tdap, should not get Tdap, unless a cause other than the vaccine was found. They can still get Td.  Talk to your doctor if you: ? have seizures or another nervous system problem, ? had severe pain or swelling after any vaccine containing diphtheria, tetanus or pertussis, ? ever had a condition called Guillain-Barr Syndrome (GBS), ? aren't feeling well on the day the shot is scheduled. 4. Risks With any medicine, including vaccines, there is a chance of side effects. These are usually mild and go away on their own. Serious reactions are also possible but are rare. Most people who get Tdap vaccine do not have any problems with it. Mild problems following Tdap: (Did not interfere with activities)  Pain where the shot was given (about 3 in 4 adolescents or 2 in 3 adults)  Redness or swelling where the shot was given (about 1 person in 5)  Mild fever of at least 100.4F (up to about 1 in 25 adolescents or 1 in 100 adults)  Headache (about 3 or 4 people in 10)  Tiredness (about 1 person in 3 or 4)  Nausea, vomiting, diarrhea, stomach ache (up to 1 in 4 adolescents or 1 in 10 adults)  Chills, sore joints (about 1 person in 10)  Body aches (about 1 person in 3 or 4)  Rash, swollen glands (uncommon)  Moderate problems following Tdap: (Interfered with activities, but did not require medical attention)  Pain where the shot was given (up to 1 in 5 or 6)  Redness or swelling where the shot was given (up to about 1 in 16 adolescents or 1 in 12 adults)  Fever over 102F (about 1 in 100 adolescents or 1 in 250 adults)  Headache (about 1 in 7 adolescents or 1 in 10 adults)  Nausea, vomiting, diarrhea, stomach ache (up to 1 or 3 people in 100)  Swelling of the entire arm where the shot was given (up to about 1 in 500).  Severe problems following Tdap: (Unable to perform usual activities; required medical attention)  Swelling, severe pain, bleeding and  redness in the arm where the shot was given (rare).  Problems that could happen after any vaccine:  People sometimes faint after a medical procedure, including vaccination. Sitting or lying down for about 15 minutes can help prevent fainting, and injuries caused by a fall. Tell your doctor if you feel dizzy, or have vision changes or ringing in the ears.  Some people get severe pain in the shoulder and have difficulty moving the arm where a shot was given. This happens very rarely.  Any medication can cause a severe allergic reaction. Such reactions from a vaccine are very rare, estimated at fewer than 1 in a million doses, and would happen within a few minutes to a few hours after the vaccination. As with any medicine, there is a very remote chance of a vaccine causing a serious injury or death. The safety of vaccines is always being monitored. For more information, visit: www.cdc.gov/vaccinesafety/ 5. What if there is a serious problem? What should I look for? Look for anything that concerns you, such as signs of a   severe allergic reaction, very high fever, or unusual behavior. Signs of a severe allergic reaction can include hives, swelling of the face and throat, difficulty breathing, a fast heartbeat, dizziness, and weakness. These would usually start a few minutes to a few hours after the vaccination. What should I do?  If you think it is a severe allergic reaction or other emergency that can't wait, call 9-1-1 or get the person to the nearest hospital. Otherwise, call your doctor.  Afterward, the reaction should be reported to the Vaccine Adverse Event Reporting System (VAERS). Your doctor might file this report, or you can do it yourself through the VAERS web site at www.vaers.hhs.gov, or by calling 1-800-822-7967. ? VAERS does not give medical advice. 6. The National Vaccine Injury Compensation Program The National Vaccine Injury Compensation Program (VICP) is a federal program that was  created to compensate people who may have been injured by certain vaccines. Persons who believe they may have been injured by a vaccine can learn about the program and about filing a claim by calling 1-800-338-2382 or visiting the VICP website at www.hrsa.gov/vaccinecompensation. There is a time limit to file a claim for compensation. 7. How can I learn more?  Ask your doctor. He or she can give you the vaccine package insert or suggest other sources of information.  Call your local or state health department.  Contact the Centers for Disease Control and Prevention (CDC): ? Call 1-800-232-4636 (1-800-CDC-INFO) or ? Visit CDC's website at www.cdc.gov/vaccines CDC Tdap Vaccine VIS (12/04/13) This information is not intended to replace advice given to you by your health care provider. Make sure you discuss any questions you have with your health care provider. Document Released: 03/28/2012 Document Revised: 06/17/2016 Document Reviewed: 06/17/2016 Elsevier Interactive Patient Education  2017 Elsevier Inc.  

## 2017-01-21 NOTE — Progress Notes (Signed)
   Jonathan Ray  MRN: [id number] DOB: 06/16/1956  PCP: [name], PA-C  Chief Complaint  Patient presents with  . Immunizations    TDAP     Subjective:  Pt presents to clinic for a small cut on his right arm on a dirty metal fence.  He is unsure when his last tetanus vaccine was and he would like it updated today.    Review of Systems  Patient Active Problem List   Diagnosis Date Noted  . Heart murmur   . Testicular cancer ([place])   . Irregular heartbeat     No current outpatient prescriptions on file prior to visit.   No current facility-administered medications on file prior to visit.    Pt patients past, family and social history were reviewed and updated.   Objective:  BP 117/71   Pulse 62   Temp 98 F (36.7 C) (Oral)   Resp 17   Ht 6' 2.5" (1.892 m)   Wt 193 lb (87.5 kg)   SpO2 97%   BMI 24.45 kg/m   Physical Exam  Constitutional: He is oriented to person, place, and time and well-developed, well-nourished, and in no distress.  HENT:  Head: Normocephalic and atraumatic.  Right Ear: External ear normal.  Left Ear: External ear normal.  Eyes: Conjunctivae are normal.  Neck: Normal range of motion.  Pulmonary/Chest: Effort normal.  Neurological: He is alert and oriented to person, place, and time. Gait normal.  Skin: Skin is warm and dry.  Psychiatric: Mood, memory, affect and judgment normal.    Assessment and Plan :  Need for Tdap vaccination - Plan: Tdap vaccine greater than or equal to 7yo IM   D/w pt a CPE would be recommended.  He does not want colonoscopy but he will think about a CPE in the future.  Windell Hummingbird PA-C  [facility] 01/21/2017 6:35 PM

## 2017-02-10 ENCOUNTER — Encounter: Payer: Self-pay | Admitting: Urgent Care

## 2017-02-10 ENCOUNTER — Ambulatory Visit (INDEPENDENT_AMBULATORY_CARE_PROVIDER_SITE_OTHER): Payer: BLUE CROSS/BLUE SHIELD | Admitting: Urgent Care

## 2017-02-10 VITALS — BP 120/72 | HR 56 | Temp 97.8°F | Resp 16 | Ht 73.5 in | Wt 189.6 lb

## 2017-02-10 DIAGNOSIS — G250 Essential tremor: Secondary | ICD-10-CM | POA: Diagnosis not present

## 2017-02-10 DIAGNOSIS — Z0000 Encounter for general adult medical examination without abnormal findings: Secondary | ICD-10-CM | POA: Diagnosis not present

## 2017-02-10 DIAGNOSIS — Z8547 Personal history of malignant neoplasm of testis: Secondary | ICD-10-CM

## 2017-02-10 DIAGNOSIS — Z87891 Personal history of nicotine dependence: Secondary | ICD-10-CM | POA: Diagnosis not present

## 2017-02-10 DIAGNOSIS — F17210 Nicotine dependence, cigarettes, uncomplicated: Secondary | ICD-10-CM | POA: Diagnosis not present

## 2017-02-10 DIAGNOSIS — F17200 Nicotine dependence, unspecified, uncomplicated: Secondary | ICD-10-CM

## 2017-02-10 NOTE — Progress Notes (Signed)
MRN: [id number]  Subjective:   [name] is a 61 y.o. male presenting for annual physical exam. Patient is married, does not have children. 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 Smokes 1-2ppd, has 40+ pack year history. Denies alcohol use.   Medical care team includes: PCP: Elan Brainerd, PA-C Vision: Last eye exam was with [facility], has new script for glasses. Dental: Gets cleanings every 6 months. Specialists: None.   Health Maintenance: Never had a colonoscopy, he is not interested in this.  [name] is not currently taking any medications. He is allergic to penicillins. Jonathan Ray  has a past medical history of Heart murmur; Irregular heartbeat; and Testicular cancer ([place]). Also  has a past surgical history that includes Testicle removal; Mouth surgery; and Surgery scrotal / testicular. He has been in remission since 2001. His family history includes Cancer in his other; Healthy in his mother; Other in his father.  Immunizations: Last tdap 01/21/2017, not interested in flu shots.  Review of Systems  Constitutional: Negative for chills, diaphoresis, fever, malaise/fatigue and weight loss.  HENT: Negative for congestion, ear discharge, ear pain, hearing loss, nosebleeds, sore throat and tinnitus.   Eyes: Negative for blurred vision, double vision, photophobia, pain, discharge and redness.  Respiratory: Negative for cough, shortness of breath and wheezing.   Cardiovascular: Negative for chest pain, palpitations and leg swelling.  Gastrointestinal: Negative for abdominal pain, blood in stool, constipation, diarrhea, nausea and vomiting.  Genitourinary: Negative for dysuria, flank pain, frequency, hematuria and urgency.  Musculoskeletal: Negative for back pain, joint pain and myalgias.  Skin: Negative for itching and rash.  Neurological: Negative for dizziness, tingling, seizures, loss of consciousness, weakness and headaches.  Endo/Heme/Allergies: Negative for polydipsia.    Psychiatric/Behavioral: Negative for depression, hallucinations, memory loss, substance abuse and suicidal ideas. The patient is not nervous/anxious and does not have insomnia.    Objective:   Vitals: BP 120/72 (BP Location: Right Arm, Patient Position: Sitting, Cuff Size: Normal)   Pulse (!) 56   Temp 97.8 F (36.6 C) (Oral)   Resp 16   Ht 6' 1.5" (1.867 m)   Wt 189 lb 9.6 oz (86 kg)   SpO2 99%   BMI 24.68 kg/m   Physical Exam  Constitutional: He is oriented to person, place, and time. He appears well-developed and well-nourished.  HENT:  TM's intact bilaterally, no effusions or erythema. Nasal turbinates pink and moist, nasal passages patent. No sinus tenderness. Oropharynx clear, mucous membranes moist, dentition in good repair.  Eyes: Conjunctivae and EOM are normal. Pupils are equal, round, and reactive to light. Right eye exhibits no discharge. Left eye exhibits no discharge. No scleral icterus.  Neck: Normal range of motion. Neck supple. No thyromegaly present.  Cardiovascular: Normal rate, regular rhythm and intact distal pulses.  Exam reveals no gallop and no friction rub.   No murmur heard. Pulmonary/Chest: No stridor. No respiratory distress. He has no wheezes. He has no rales.  Abdominal: Soft. Bowel sounds are normal. He exhibits no distension and no mass. There is no tenderness.  Musculoskeletal: Normal range of motion. He exhibits no edema or tenderness.  Lymphadenopathy:    He has no cervical adenopathy.  Neurological: He is alert and oriented to person, place, and time. He has normal reflexes. He displays normal reflexes.  Tremor of head.  Skin: Skin is warm and dry. No rash noted. No erythema. No pallor.  Psychiatric: He has a normal mood and affect.   Assessment and Plan :  1. Annual physical exam - Medically stable, labs pending. Discussed healthy lifestyle, diet, exercise, preventative care, vaccinations, and addressed patient's concerns.  -  Comprehensive metabolic panel - Lipid panel - CBC  2. Tobacco use disorder 3. History of smoking 25-50 pack years - Discussed smoking cessation with patient, he plans on cutting back smoking together with his wife when they move into a new home which should be within 1 year. He will consider medical therapy in the future. - Ambulatory Referral for Lung Cancer Scre  4. History of testicular cancer - Stable  5. Benign head tremor - Stable, patient will let me know if he wants to pursue treatment or referral.  [name], PA-C [facility] [place] [facility] [phone] 02/10/2017  9:19 AM

## 2017-02-10 NOTE — Patient Instructions (Addendum)
Keeping you healthy  Get these tests  Blood pressure- Have your blood pressure checked once a year by your healthcare provider.  Normal blood pressure is 120/80  Weight- Have your body mass index (BMI) calculated to screen for obesity.  BMI is a measure of body fat based on height and weight. You can also calculate your own BMI at [email or web address]/bmi/.  Cholesterol- Have your cholesterol checked every year.  Diabetes- Have your blood sugar checked regularly if you have high blood pressure, high cholesterol, have a family history of diabetes or if you are overweight.  Screening for Colon Cancer- Colonoscopy starting at age 50.  Screening may begin sooner depending on your family history and other health conditions. Follow up colonoscopy as directed by your Gastroenterologist.  Screening for Prostate Cancer- Both blood work (PSA) and a rectal exam help screen for Prostate Cancer.  Screening begins at age 40 with African-American men and at age 50 with Caucasian men.  Screening may begin sooner depending on your family history.  Take these medicines  Aspirin- One aspirin daily can help prevent Heart disease and Stroke.  Flu shot- Every fall.  Tetanus- Every 10 years.  Zostavax- Once after the age of 60 to prevent Shingles.  Pneumonia shot- Once after the age of 65; if you are younger than 65, ask your healthcare provider if you need a Pneumonia shot.  Take these steps  Don't smoke- If you do smoke, talk to your doctor about quitting.  For tips on how to quit, go to www.smokefree.gov or call 1-800-QUIT-NOW.  Be physically active- Exercise 5 days a week for at least 30 minutes.  If you are not already physically active start slow and gradually work up to 30 minutes of moderate physical activity.  Examples of moderate activity include walking briskly, mowing the yard, dancing, swimming, bicycling, etc.  Eat a healthy diet- Eat a variety of healthy food such as fruits, vegetables,  low fat milk, low fat cheese, yogurt, lean meant, poultry, fish, beans, tofu, etc. For more information go to www.thenutritionsource.org  Drink alcohol in moderation- Limit alcohol intake to less than two drinks a day. Never drink and drive.  Dentist- Brush and floss twice daily; visit your dentist twice a year.  Depression- Your emotional health is as important as your physical health. If you're feeling down, or losing interest in things you would normally enjoy please talk to your healthcare provider.  Eye exam- Visit your eye doctor every year.  Safe sex- If you may be exposed to a sexually transmitted infection, use a condom.  Seat belts- Seat belts can save your life; always wear one.  Smoke/Carbon Monoxide detectors- These detectors need to be installed on the appropriate level of your home.  Replace batteries at least once a year.  Skin cancer- When out in the sun, cover up and use sunscreen 15 SPF or higher.  Violence- If anyone is threatening you, please tell your healthcare provider.  Living Will/ Health care power of attorney- Speak with your healthcare provider and family.   IF you received an x-ray today, you will receive an invoice from [facility]. Please contact [facility] at [phone] with questions or concerns regarding your invoice.   IF you received labwork today, you will receive an invoice from [facility]. Please contact [facility] at [phone] with questions or concerns regarding your invoice.   Our billing staff will not be able to assist you with questions regarding bills from these companies.  You will be contacted   with the lab results as soon as they are available. The fastest way to get your results is to activate your My Chart account. Instructions are located on the last page of this paperwork. If you have not heard from us regarding the results in 2 weeks, please contact this office.     

## 2017-02-11 LAB — CBC
HEMATOCRIT: 44 % (ref 37.5–51.0)
Hemoglobin: 15.3 g/dL (ref 13.0–17.7)
MCH: 31.5 pg (ref 26.6–33.0)
MCHC: 34.8 g/dL (ref 31.5–35.7)
MCV: 91 fL (ref 79–97)
Platelets: 215 10*3/uL (ref 150–379)
RBC: 4.86 x10E6/uL (ref 4.14–5.80)
RDW: 13.8 % (ref 12.3–15.4)
WBC: 5.4 10*3/uL (ref 3.4–10.8)

## 2017-02-11 LAB — COMPREHENSIVE METABOLIC PANEL
ALBUMIN: 4.4 g/dL (ref 3.5–5.5)
ALT: 17 IU/L (ref 0–44)
AST: 16 IU/L (ref 0–40)
Albumin/Globulin Ratio: 2 (ref 1.2–2.2)
Alkaline Phosphatase: 60 IU/L (ref 39–117)
BUN/Creatinine Ratio: 9 (ref 9–20)
BUN: 8 mg/dL (ref 6–24)
Bilirubin Total: 0.5 mg/dL (ref 0.0–1.2)
CALCIUM: 9.2 mg/dL (ref 8.7–10.2)
CHLORIDE: 101 mmol/L (ref 96–106)
CO2: 27 mmol/L (ref 18–29)
Creatinine, Ser: 0.85 mg/dL (ref 0.76–1.27)
GFR calc Af Amer: 110 mL/min/{1.73_m2} (ref >59)
GFR, EST NON AFRICAN AMERICAN: 95 mL/min/{1.73_m2} (ref >59)
GLOBULIN, TOTAL: 2.2 g/dL (ref 1.5–4.5)
GLUCOSE: 85 mg/dL (ref 65–99)
POTASSIUM: 4.6 mmol/L (ref 3.5–5.2)
Sodium: 143 mmol/L (ref 134–144)
Total Protein: 6.6 g/dL (ref 6.0–8.5)

## 2017-02-11 LAB — LIPID PANEL
Chol/HDL Ratio: 4.4 ratio (ref 0.0–5.0)
Cholesterol, Total: 161 mg/dL (ref 100–199)
HDL: 37 mg/dL — AB (ref >39)
LDL Calculated: 107 mg/dL — ABNORMAL HIGH (ref 0–99)
TRIGLYCERIDES: 84 mg/dL (ref 0–149)
VLDL CHOLESTEROL CAL: 17 mg/dL (ref 5–40)

## 2017-02-18 ENCOUNTER — Other Ambulatory Visit: Payer: Self-pay | Admitting: Acute Care

## 2017-02-18 DIAGNOSIS — F17210 Nicotine dependence, cigarettes, uncomplicated: Secondary | ICD-10-CM

## 2017-03-02 ENCOUNTER — Ambulatory Visit (INDEPENDENT_AMBULATORY_CARE_PROVIDER_SITE_OTHER): Payer: BLUE CROSS/BLUE SHIELD | Admitting: Acute Care

## 2017-03-02 ENCOUNTER — Ambulatory Visit: Admission: RE | Admit: 2017-03-02 | Payer: PRIVATE HEALTH INSURANCE | Source: Ambulatory Visit

## 2017-03-02 ENCOUNTER — Encounter: Payer: Self-pay | Admitting: Acute Care

## 2017-03-02 DIAGNOSIS — F17210 Nicotine dependence, cigarettes, uncomplicated: Secondary | ICD-10-CM

## 2017-03-02 NOTE — Progress Notes (Signed)
Shared Decision Making Visit Lung Cancer Screening Program [phone])   Eligibility:  Age 61 y.o.  Pack Years Smoking History Calculation 84 pack year smoker (# packs/per year x # years smoked)  Recent History of coughing up blood  no  Unexplained weight loss? no ( >Than 15 pounds within the last 6 months )  Prior History Lung / other cancer no (Diagnosis within the last 5 years already requiring surveillance chest CT Scans).  Smoking Status Current Smoker  Former Smokers: Years since quit:NA  Quit Date:NA  Visit Components:  Discussion included one or more decision making aids. yes  Discussion included risk/benefits of screening. yes  Discussion included potential follow up diagnostic testing for abnormal scans. yes  Discussion included meaning and risk of over diagnosis. yes  Discussion included meaning and risk of False Positives. yes  Discussion included meaning of total radiation exposure. yes  Counseling Included:  Importance of adherence to annual lung cancer LDCT screening. yes  Impact of comorbidities on ability to participate in the program. yes  Ability and willingness to under diagnostic treatment. yes  Smoking Cessation Counseling:  Current Smokers:   Discussed importance of smoking cessation. yes  Information about tobacco cessation classes and interventions provided to patient. yes  Patient provided with "ticket" for LDCT Scan. yes  Symptomatic Patient. no  Counseling  Diagnosis Code: Tobacco Use Z72.0  Asymptomatic Patient yes  Counseling (Intermediate counseling: > three minutes counseling) M8413  Former Smokers:   Discussed the importance of maintaining cigarette abstinence. yes  Diagnosis Code: Personal History of Nicotine Dependence. K44.010  Information about tobacco cessation classes and interventions provided to patient. Yes  Patient provided with "ticket" for LDCT Scan. yes  Written Order for Lung Cancer Screening with  LDCT placed in Epic. Yes (CT Chest Lung Cancer Screening Low Dose W/O CM) [id number] Z12.2-Screening of respiratory organs Z87.891-Personal history of nicotine dependence  I have spent 25 minutes of face to face time with Mr. Nickson discussing the risks and benefits of lung cancer screening. We viewed a power point together that explained in detail the above noted topics. We paused at intervals to allow for questions to be asked and answered to ensure understanding.We discussed that the single most powerful action that he can take to decrease his risk of developing lung cancer is to quit smoking. We discussed whether or not he is ready to commit to setting a quit date. He is not ready to set a quit date. We discussed options for tools to aid in quitting smoking including nicotine replacement therapy, non-nicotine medications, support groups, Quit Smart classes, and behavior modification. We discussed that often times setting smaller, more achievable goals, such as eliminating 1 cigarette a day for a week and then 2 cigarettes a day for a week can be helpful in slowly decreasing the number of cigarettes smoked. This allows for a sense of accomplishment as well as providing a clinical benefit. I gave him  the " Be Stronger Than Your Excuses" card with contact information for community resources, classes, free nicotine replacement therapy, and access to mobile apps, text messaging, and on-line smoking cessation help. I have also given him my card and contact information in the event he needs to contact me. We discussed the time and location of the scan, and that either Doroteo Glassman RN or I will call with the results within 24-48 hours of receiving them. I have offered him  a copy of the power point we viewed  as a resource  in the event they need reinforcement of the concepts we discussed today in the office. The patient verbalized understanding of all of  the above and had no further questions upon leaving the  office. They have my contact information in the event they have any further questions.  I spent 4 minutes counseling on smoking cessation and the health risks of continued tobacco abuse.  I explained to the patient that there has been a high incidence of coronary artery disease noted on these exams. I explained that this is a non-gated exam therefore degree or severity cannot be determined. This patient is not currently on statin therapy. I have asked the patient to follow-up with their PCP regarding any incidental finding of coronary artery disease and management with diet or medication as their PCP  feels is clinically indicated. The patient verbalized understanding of the above and had no further questions upon completion of the visit.    Magdalen Spatz, NP 03/02/2017

## 2017-03-29 ENCOUNTER — Ambulatory Visit (INDEPENDENT_AMBULATORY_CARE_PROVIDER_SITE_OTHER)
Admission: RE | Admit: 2017-03-29 | Discharge: 2017-03-29 | Disposition: A | Discharge status: Home / Self Care | Payer: BLUE CROSS/BLUE SHIELD | Source: Ambulatory Visit | Attending: Acute Care | Admitting: Acute Care

## 2017-03-29 DIAGNOSIS — F17210 Nicotine dependence, cigarettes, uncomplicated: Secondary | ICD-10-CM

## 2017-03-29 DIAGNOSIS — Z87891 Personal history of nicotine dependence: Secondary | ICD-10-CM | POA: Diagnosis not present

## 2017-03-30 ENCOUNTER — Other Ambulatory Visit: Payer: Self-pay | Admitting: Acute Care

## 2017-03-30 DIAGNOSIS — F17210 Nicotine dependence, cigarettes, uncomplicated: Secondary | ICD-10-CM

## 2017-12-14 ENCOUNTER — Ambulatory Visit: Payer: Self-pay | Admitting: Urgent Care

## 2017-12-16 ENCOUNTER — Ambulatory Visit (INDEPENDENT_AMBULATORY_CARE_PROVIDER_SITE_OTHER): Payer: Self-pay | Admitting: Physician Assistant

## 2017-12-16 ENCOUNTER — Other Ambulatory Visit: Payer: Self-pay

## 2017-12-16 ENCOUNTER — Encounter: Payer: Self-pay | Admitting: Physician Assistant

## 2017-12-16 VITALS — BP 102/60 | HR 85 | Temp 98.3°F | Resp 18 | Ht 73.5 in | Wt 189.4 lb

## 2017-12-16 DIAGNOSIS — Z024 Encounter for examination for driving license: Secondary | ICD-10-CM

## 2017-12-16 DIAGNOSIS — G250 Essential tremor: Secondary | ICD-10-CM

## 2017-12-16 NOTE — Patient Instructions (Signed)
     IF you received an x-ray today, you will receive an invoice from [facility]. Please contact [facility] at [phone] with questions or concerns regarding your invoice.   IF you received labwork today, you will receive an invoice from [facility]. Please contact [facility] at [phone] with questions or concerns regarding your invoice.   Our billing staff will not be able to assist you with questions regarding bills from these companies.  You will be contacted with the lab results as soon as they are available. The fastest way to get your results is to activate your My Chart account. Instructions are located on the last page of this paperwork. If you have not heard from us regarding the results in 2 weeks, please contact this office.     

## 2017-12-16 NOTE — Progress Notes (Signed)
This patient presents for DOT examination for fitness for duty.  Patient Active Problem List   Diagnosis Date Noted  . Heart murmur   . 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 Irregular heartbeat - sounds like PVCs      Medical History:  no  1. Head/Brain Injuries, disorders or illnesses - h/o cancer - 2001 - no current problems no  2. Seizures, epilepsy no  3. Eye disorders or impaired vision (except corrective lenses) - wear glasses no  4. Ear disorders, loss of hearing or balance no  5. Heart disease or heart attack, other cardiovascular condition no  6. Heart surgery (valve replacement/bypass, angioplasty, pacemaker/defribrillator) no  7. High blood pressure no  8. High holesterol no  9. Chronic cough, shortness of breath or other breathing problems no 10. Lung disease (emphysema, asthma or chronic bronchitis) no  11. Kidney disease, dialysis no  12. Digestive problems  no  13. Diabetes or elevated blood sugar  no  Insulin use no  14. Nervous or psychiatric disorders, e.g., severe depression no  15. Fainting or syncope no  16. Dizziness, headaches, numbness, tingling or memory loss no  17. Unexplained weight loss no  18. Stroke, TIA or paralysis no  19. Missing or impaired hand, arm, foot, leg, finger, toe no  20. Spinal injury or disease no  21. Bone, muscles or nerve problems no  22. Blood clots or bleeding bleeding disorders yes  23. Cancer - testicular history - resolved no  24. Chronic infection or other chronic diseases no  25. Sleep disorders, pauses in breathing while asleep, daytime sleepiness, loud snoring no  26. Have you ever had a sleep test? no  27.  Have you ever spent a night in the hospital? no  28. Have you ever had a broken bone? yes  29. Have you or or do you use tobacco products? <1 ppd no  30. Regular, frequent alcohol use no  31. Illegal substance use within the past 2 years no  32.  Have you ever failed a drug test or been dependent on an illegal  substance?  Current Medications: Prior to Admission medications   Not on File     TESTING:   Visual Acuity Screening   Right eye Left eye Both eyes  Without correction:     With correction: 20/25 20/25 20/25-1  Comments: Pt passed color test.  Pt horizontal field of vision was 55.   Hearing Screening Comments: Passed whisper test.   Monocular Vision: Yes.    Hearing Aid used for test: No. Hearing Aid required to to meet standard: No.  BP 102/60   Pulse 85   Temp 98.3 F (36.8 C) (Oral)   Resp 18   Ht 6' 1.5" (1.867 m)   Wt 189 lb 6.4 oz (85.9 kg)   SpO2 100%   BMI 24.65 kg/m  Pulse rate is regular  Comments: UA     SG 1.005   BL neg   PR neg    GL neg   PHYSICAL EXAMINATION:  1. normal  General Appearance: Marked overweight, tremor, signs of alcoholism, problem drinking or drug abuse. 2. Normal Skin Exam - tattoos and abdominal scars 3. Normal Eyes: pupillary equality, reaction to light, accommodation, ocular motility, ocular muscle imbalance, extra ocular movement, nystagmus, exopthalmos. Ask about retinopathy, cataracts, aphakia, glaucoma, macular degeneration and refer to a specialist if appropriate.  4. Normal Ears: Scarring of tympanic membrane, occlusion of external canal, perforated eardrums.  5. Normal Mouth and Throat: Irremedial deformities likely to interfere with breathing or swallowing.    6. Normal Heart: Murmurs, extra sounds, enlarged heart, pacemaker, implantable defibrillator.     7. Normal Lungs and Chest, not including breast examination: Abnormal Chest wall expansion, abnormal respiratory rate, abnormal breath sounds including wheezes or alveolar rates, impaired respiratory function, cyanosis. Abnormal findings on physical exam may require further testing such as pulmonary tests and/or x ray of chest.  8. Normal Abdomen and Viscera: Enlarged liver, enlarged spleen, masses, bruits, hernia, significant abdominal wall muscle weakness.  9.  Normal Genitourinary System: Hernia  10. Normal Spine, other musculoskeletal: Previous surgery, deformities, limitation of motion, tenderness. 11. Normal Extremities-Limb impaired: Loss or impairment of leg, foot, toe, arm, hand, finger. Perceptible limp, deformities, atrophy, weakness, paralysis, clubbing, edema, hypotonia. Insufficient grasp and prehension to maintain steering wheel grip. Insufficient mobility and strength in lower limb to operate pedals properly. 12. Abnormal- head tremor - mild Neurological: Impaired equilibrium, coordination or speech pattern; paresthesia, asymmetric deep tendon reflexes, sensory or positional abnormalities, abnormal patellar and Babinski's reflexes 13. Normal Gait - antalgic, ataxia  14. Normal Vascular System: Abnormal pulse and amplitude, carotid or arterial bruits, varicose veins.   Does not meet standards. Certification Status: does not meet standards for 2 year certificate. Meets standards, but periodic monitoring required due to: needs peripheral vision checked (likey related to our equipment  Driver qualified only for: 3 months  Wearing corrective lenses: yes Wearing hearing aid: no Accompanied by a no waiver/exemption  Certification expires 03/18/2018  Windell Hummingbird PA-C  [facility] 12/16/2017 9:48 AM

## 2018-01-17 ENCOUNTER — Ambulatory Visit: Payer: Self-pay | Admitting: Physician Assistant

## 2018-01-17 NOTE — Progress Notes (Addendum)
Pt returns with documentation from his ophthalmologist stating his periphery vision is 70 lateral horizontal field.  Documentation is scanned into media.  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

## 2018-02-06 ENCOUNTER — Ambulatory Visit (INDEPENDENT_AMBULATORY_CARE_PROVIDER_SITE_OTHER): Payer: PRIVATE HEALTH INSURANCE | Admitting: Family Medicine

## 2018-02-06 ENCOUNTER — Encounter: Payer: Self-pay | Admitting: Family Medicine

## 2018-02-06 ENCOUNTER — Other Ambulatory Visit: Payer: Self-pay

## 2018-02-06 VITALS — BP 120/62 | HR 69 | Temp 98.3°F | Resp 18 | Ht 73.5 in | Wt 191.2 lb

## 2018-02-06 DIAGNOSIS — J069 Acute upper respiratory infection, unspecified: Secondary | ICD-10-CM | POA: Diagnosis not present

## 2018-02-06 DIAGNOSIS — F17200 Nicotine dependence, unspecified, uncomplicated: Secondary | ICD-10-CM

## 2018-02-06 DIAGNOSIS — J432 Centrilobular emphysema: Secondary | ICD-10-CM | POA: Diagnosis not present

## 2018-02-06 MED ORDER — DEXTROMETHORPHAN POLISTIREX ER 30 MG/5ML PO SUER: 30.0000 mg | Freq: Two times a day (BID) | ORAL | 0 refills | Status: DC

## 2018-02-06 MED ORDER — ALBUTEROL SULFATE HFA 108 (90 BASE) MCG/ACT IN AERS: 2.0000 | INHALATION_SPRAY | RESPIRATORY_TRACT | 1 refills | Status: DC | PRN

## 2018-02-06 MED ORDER — IPRATROPIUM BROMIDE 0.03 % NA SOLN: 2.0000 | Freq: Four times a day (QID) | NASAL | 1 refills | Status: DC

## 2018-02-06 MED ORDER — PSEUDOEPHEDRINE-GUAIFENESIN ER 120-1200 MG PO TB12: 1.0000 | ORAL_TABLET | Freq: Two times a day (BID) | ORAL | 0 refills | Status: DC

## 2018-02-06 MED ORDER — CETIRIZINE-PSEUDOEPHEDRINE ER 5-120 MG PO TB12: 1.0000 | ORAL_TABLET | Freq: Two times a day (BID) | ORAL | 1 refills | Status: DC

## 2018-02-06 MED ORDER — MUCINEX DM MAXIMUM STRENGTH 60-1200 MG PO TB12: 1.0000 | ORAL_TABLET | Freq: Two times a day (BID) | ORAL | 1 refills | Status: DC

## 2018-02-06 NOTE — Patient Instructions (Signed)
You are at very high risk of this becoming bronchitis which is when the cough will get worse for another few days but then you will feel better in another week though you will cough for at least 3 more weeks total.  Using your albuterol as frequently as possible now will hopefully prevent this outcome - unfortunately, an antibiotic will NOT help prevent it as >90% of all bronchitis infections are viral.   Upper Respiratory Infection, Adult Most upper respiratory infections (URIs) are a viral infection of the air passages leading to the lungs. A URI affects the nose, throat, and upper air passages. The most common type of URI is nasopharyngitis and is typically referred to as "the common cold." URIs run their course and usually go away on their own. Most of the time, a URI does not require medical attention, but sometimes a bacterial infection in the upper airways can follow a viral infection. This is called a secondary infection. Sinus and middle ear infections are common types of secondary upper respiratory infections. Bacterial pneumonia can also complicate a URI. A URI can worsen asthma and chronic obstructive pulmonary disease (COPD). Sometimes, these complications can require emergency medical care and may be life threatening. What are the causes? Almost all URIs are caused by viruses. A virus is a type of germ and can spread from one person to another. What increases the risk? You may be at risk for a URI if:  You smoke.  You have chronic heart or lung disease.  You have a weakened defense (immune) system.  You are very young or very old.  You have nasal allergies or asthma.  You work in crowded or poorly ventilated areas.  You work in health care facilities or schools.  What are the signs or symptoms? Symptoms typically develop 2-3 days after you come in contact with a cold virus. Most viral URIs last 7-10 days. However, viral URIs from the influenza virus (flu virus) can last 14-18  days and are typically more severe. Symptoms may include:  Runny or stuffy (congested) nose.  Sneezing.  Cough.  Sore throat.  Headache.  Fatigue.  Fever.  Loss of appetite.  Pain in your forehead, behind your eyes, and over your cheekbones (sinus pain).  Muscle aches.  How is this diagnosed? Your health care provider may diagnose a URI by:  Physical exam.  Tests to check that your symptoms are not due to another condition such as: ? Strep throat. ? Sinusitis. ? Pneumonia. ? Asthma.  How is this treated? A URI goes away on its own with time. It cannot be cured with medicines, but medicines may be prescribed or recommended to relieve symptoms. Medicines may help:  Reduce your fever.  Reduce your cough.  Relieve nasal congestion.  Follow these instructions at home:  Take medicines only as directed by your health care provider.  Gargle warm saltwater or take cough drops to comfort your throat as directed by your health care provider.  Use a warm mist humidifier or inhale steam from a shower to increase air moisture. This may make it easier to breathe.  Drink enough fluid to keep your urine clear or pale yellow.  Eat soups and other clear broths and maintain good nutrition.  Rest as needed.  Return to work when your temperature has returned to normal or as your health care provider advises. You may need to stay home longer to avoid infecting others. You can also use a face mask and careful hand  washing to prevent spread of the virus.  Increase the usage of your inhaler if you have asthma.  Do not use any tobacco products, including cigarettes, chewing tobacco, or electronic cigarettes. If you need help quitting, ask your health care provider. How is this prevented? The best way to protect yourself from getting a cold is to practice good hygiene.  Avoid oral or hand contact with people with cold symptoms.  Wash your hands often if contact occurs.  There  is no clear evidence that vitamin C, vitamin E, echinacea, or exercise reduces the chance of developing a cold. However, it is always recommended to get plenty of rest, exercise, and practice good nutrition. Contact a health care provider if:  You are getting worse rather than better.  Your symptoms are not controlled by medicine.  You have chills.  You have worsening shortness of breath.  You have brown or red mucus.  You have yellow or brown nasal discharge.  You have pain in your face, especially when you bend forward.  You have a fever.  You have swollen neck glands.  You have pain while swallowing.  You have white areas in the back of your throat. Get help right away if:  You have severe or persistent: ? Headache. ? Ear pain. ? Sinus pain. ? Chest pain.  You have chronic lung disease and any of the following: ? Wheezing. ? Prolonged cough. ? Coughing up blood. ? A change in your usual mucus.  You have a stiff neck.  You have changes in your: ? Vision. ? Hearing. ? Thinking. ? Mood. This information is not intended to replace advice given to you by your health care provider. Make sure you discuss any questions you have with your health care provider. Document Released: 03/23/2001 Document Revised: 05/30/2016 Document Reviewed: 01/02/2014 Elsevier Interactive Patient Education  2018 [employer].  Acute Bronchitis, Adult Acute bronchitis is sudden (acute) swelling of the air tubes (bronchi) in the lungs. Acute bronchitis causes these tubes to fill with mucus, which can make it hard to breathe. It can also cause coughing or wheezing. In adults, acute bronchitis usually goes away within 2 weeks. A cough caused by bronchitis may last up to 3 weeks. Smoking, allergies, and asthma can make the condition worse. Repeated episodes of bronchitis may cause further lung problems, such as chronic obstructive pulmonary disease (COPD). What are the causes? This condition  can be caused by germs and by substances that irritate the lungs, including:  Cold and flu viruses. This condition is most often caused by the same virus that causes a cold.  Bacteria.  Exposure to tobacco smoke, dust, fumes, and air pollution.  What increases the risk? This condition is more likely to develop in people who:  Have close contact with someone with acute bronchitis.  Are exposed to lung irritants, such as tobacco smoke, dust, fumes, and vapors.  Have a weak immune system.  Have a respiratory condition such as asthma.  What are the signs or symptoms? Symptoms of this condition include:  A cough.  Coughing up clear, yellow, or green mucus.  Wheezing.  Chest congestion.  Shortness of breath.  A fever.  Body aches.  Chills.  A sore throat.  How is this diagnosed? This condition is usually diagnosed with a physical exam. During the exam, your health care provider may order tests, such as chest X-rays, to rule out other conditions. He or she may also:  Test a sample of your mucus for  bacterial infection.  Check the level of oxygen in your blood. This is done to check for pneumonia.  Do a chest X-ray or lung function testing to rule out pneumonia and other conditions.  Perform blood tests.  Your health care provider will also ask about your symptoms and medical history. How is this treated? Most cases of acute bronchitis clear up over time without treatment. Your health care provider may recommend:  Drinking more fluids. Drinking more makes your mucus thinner, which may make it easier to breathe.  Taking a medicine for a fever or cough.  Taking an antibiotic medicine.  Using an inhaler to help improve shortness of breath and to control a cough.  Using a cool mist vaporizer or humidifier to make it easier to breathe.  Follow these instructions at home: Medicines  Take over-the-counter and prescription medicines only as told by your health care  provider.  If you were prescribed an antibiotic, take it as told by your health care provider. Do not stop taking the antibiotic even if you start to feel better. General instructions  Get plenty of rest.  Drink enough fluids to keep your urine clear or pale yellow.  Avoid smoking and secondhand smoke. Exposure to cigarette smoke or irritating chemicals will make bronchitis worse. If you smoke and you need help quitting, ask your health care provider. Quitting smoking will help your lungs heal faster.  Use an inhaler, cool mist vaporizer, or humidifier as told by your health care provider.  Keep all follow-up visits as told by your health care provider. This is important. How is this prevented? To lower your risk of getting this condition again:  Wash your hands often with soap and water. If soap and water are not available, use hand sanitizer.  Avoid contact with people who have cold symptoms.  Try not to touch your hands to your mouth, nose, or eyes.  Make sure to get the flu shot every year.  Contact a health care provider if:  Your symptoms do not improve in 2 weeks of treatment. Get help right away if:  You cough up blood.  You have chest pain.  You have severe shortness of breath.  You become dehydrated.  You faint or keep feeling like you are going to faint.  You keep vomiting.  You have a severe headache.  Your fever or chills gets worse. This information is not intended to replace advice given to you by your health care provider. Make sure you discuss any questions you have with your health care provider. Document Released: 11/04/2004 Document Revised: 04/21/2016 Document Reviewed: 03/17/2016 Elsevier Interactive Patient Education  [employer].

## 2018-02-06 NOTE — Progress Notes (Signed)
Subjective:  By signing my name below, I, Jonathan Ray, attest that this documentation has been prepared under the direction and in the presence of Delman Cheadle, MD. Electronically Signed: Moises Ray, [place]. 02/06/2018 , 5:40 PM .  Patient was seen in Room 3 .   Patient ID: Jonathan Ray, male    DOB: 17-Nov-1955, 62 y.o.   MRN: [id number] Chief Complaint  Patient presents with  . Cough    x3 days, pt states "he has random coughing, sneezing, and chill". Pt states he had tried a few different OTC meds.   HPI Jonathan DYMEK is a 62 y.o. male who presents to Primary Care at [facility] complaining of cough, sneezing and chills that started about 3 days ago. He reports productive cough (yellow-light yellow phlegm) with some postnasal drip. He's been taking OTC medications: Max Strength Sinus Relief, Tussin-DM, and daytime multi-symptom. He is able to sleep at night - sxs not keeping him awake.   He usually doesn't have seasonal allergies. He doesn't recall using inhaler previously.   He's had palpitations since he was a child; no change with it recently. He denies fever, shortness of breath, chest pain or itchy eyes.   He reports smoking about 1 ppd. He doesn't smoke in the house so slightly limited now. He previously reported smoking 2 ppd.   He quit drinking alcohol in 1992 so wants to avoid cough syrups with to high of an EtOH content.   Past Medical History:  Diagnosis Date  . Heart murmur   . Irregular heartbeat   . Testicular cancer [facility])    Past Surgical History:  Procedure Laterality Date  . MOUTH SURGERY    . SURGERY SCROTAL / TESTICULAR    . TESTICLE REMOVAL     Prior to Admission medications   Not on File   Allergies  Allergen Reactions  . Penicillins Other (See Comments)    Does not remember  Has patient had a PCN reaction causing immediate rash, facial/tongue/throat swelling, SOB or lightheadedness with hypotension: Unknown Has patient had a PCN reaction causing  severe rash involving mucus membranes or skin necrosis: Unknown Has patient had a PCN reaction that required hospitalization Unknown Has patient had a PCN reaction occurring within the last 10 years: No If all of the above answers are "NO", then may proceed with Cephalosporin use.     Family History  Problem Relation Age of Onset  . Other Father        eye sight loss  . Healthy Mother   . Cancer Other    Social History   Socioeconomic History  . Marital status: Married    Spouse name: Not on file  . Number of children: Not on file  . Years of education: Not on file  . Highest education level: Not on file  Occupational History  . Not on file  Social Needs  . Financial resource strain: Not on file  . Food insecurity:    Worry: Not on file    Inability: Not on file  . Transportation needs:    Medical: Not on file    Non-medical: Not on file  Tobacco Use  . Smoking status: Current Every Day Smoker    Packs/day: 2.00    Years: 40.00    Pack years: 80.00    Types: Cigarettes  . Smokeless tobacco: Never Used  . Tobacco comment: Using reduce to quit method, down to 10 cigarettes daily  Substance and Sexual Activity  .  Alcohol use: No    Alcohol/week: 0.0 oz    Comment: former   . Drug use: No  . Sexual activity: Not on file  Lifestyle  . Physical activity:    Days per week: Not on file    Minutes per session: Not on file  . Stress: Not on file  Relationships  . Social connections:    Talks on phone: Not on file    Gets together: Not on file    Attends religious service: Not on file    Active member of club or organization: Not on file    Attends meetings of clubs or organizations: Not on file    Relationship status: Not on file  Other Topics Concern  . Not on file  Social History Narrative  . Not on file   Depression screen [facility] 2/9 02/06/2018 12/16/2017 02/10/2017 01/21/2017 01/14/2016  Decreased Interest 0 0 0 0 0  Down, Depressed, Hopeless 0 0 0 0 0  PHQ - 2 Score  0 0 0 0 0    Review of Systems  Constitutional: Positive for chills. Negative for fatigue, fever and unexpected weight change.  HENT: Positive for postnasal drip and sneezing.   Eyes: Negative for itching and visual disturbance.  Respiratory: Positive for cough. Negative for chest tightness, shortness of breath and wheezing.   Cardiovascular: Negative for chest pain, palpitations (unchanged) and leg swelling.  Gastrointestinal: Negative for abdominal pain and Ray in stool.  Allergic/Immunologic: Negative for environmental allergies.  Neurological: Negative for dizziness, light-headedness and headaches.  Psychiatric/Behavioral: Negative for sleep disturbance.       Objective:   Physical Exam  Constitutional: He is oriented to person, place, and time. He appears well-developed and well-nourished. No distress.  HENT:  Head: Normocephalic and atraumatic.  Right Ear: Tympanic membrane is injected.  Left Ear: Tympanic membrane is injected.  Nose: Rhinorrhea (dry rhinitis) present.  Mouth/Throat: Posterior oropharyngeal erythema present.  Eyes: Pupils are equal, round, and reactive to light. EOM are normal.  Neck: Neck supple. No thyromegaly present.  Cardiovascular: Normal rate and regular rhythm.  Pulmonary/Chest: Effort normal and breath sounds normal. No respiratory distress.  Bronchial breath sounds at bases  Musculoskeletal: Normal range of motion.  Lymphadenopathy:       Head (right side): Tonsillar adenopathy present.       Head (left side): Tonsillar adenopathy present.    He has no cervical adenopathy.       Right: No supraclavicular adenopathy present.       Left: No supraclavicular adenopathy present.  Neurological: He is alert and oriented to person, place, and time.  Skin: Skin is warm and dry.  Psychiatric: He has a normal mood and affect. His behavior is normal.  Nursing note and vitals reviewed.   BP 120/62 (BP Location: Left Arm, Patient Position: Sitting, Cuff  Size: Normal)   Pulse 69   Temp 98.3 F (36.8 C) (Oral)   Resp 18   Ht 6' 1.5" (1.867 m)   Wt 191 lb 3.2 oz (86.7 kg)   SpO2 98%   BMI 24.88 kg/m      Assessment & Plan:   1. Acute upper respiratory infection -suspect viral but also possible that it is allergies so rec trying zyrtec-D bid and Mucinex DM bid for symptomatic treatment. Atrovent nasal spray  2. Centrilobular emphysema (HCC) - has not used inhaler prior - try albuterol to help with cough and prevent development of prolonged 3wks of bronchitis (though may be inevitable).  3.      Tobacco use d/o - pt precontemplative for cessation at this time but is cutting down - is UTD with his annual screening lung CT scan (done 03/2017)   Meds ordered this encounter  Medications  . albuterol (PROVENTIL HFA;VENTOLIN HFA) 108 (90 Base) MCG/ACT inhaler    Sig: Inhale 2 puffs into the lungs every 4 (four) hours as needed for wheezing or shortness of breath (cough, shortness of breath or wheezing.).    Dispense:  1 Inhaler    Refill:  1                                . ipratropium (ATROVENT) 0.03 % nasal spray    Sig: Place 2 sprays into the nose 4 (four) times daily.    Dispense:  30 mL    Refill:  1  . cetirizine-pseudoephedrine (ZYRTEC-D) 5-120 MG tablet    Sig: Take 1 tablet by mouth 2 (two) times daily.    Dispense:  28 tablet    Refill:  1    D/c prior rxs for mucinex D and delsym  . Dextromethorphan-guaiFENesin (MUCINEX DM MAXIMUM STRENGTH) 60-1200 MG TB12    Sig: Take 1 tablet by mouth every 12 (twelve) hours.    Dispense:  14 each    Refill:  1    D/c prior rxs for mucinex D and delsym    I personally performed the services described in this documentation, which was scribed in my presence. The recorded information has been reviewed and considered, and addended by me as needed.   Delman Cheadle, M.D.  Primary Care at [facility] [address] [place], [place] [address] [phone] phone [phone]  fax  02/08/18 11:22 AM

## 2018-02-08 DIAGNOSIS — J432 Centrilobular emphysema: Secondary | ICD-10-CM | POA: Insufficient documentation

## 2018-02-08 DIAGNOSIS — F17200 Nicotine dependence, unspecified, uncomplicated: Secondary | ICD-10-CM | POA: Insufficient documentation

## 2018-11-13 ENCOUNTER — Encounter (HOSPITAL_COMMUNITY): Payer: Self-pay | Admitting: Emergency Medicine

## 2018-11-13 ENCOUNTER — Emergency Department (HOSPITAL_COMMUNITY)
Admission: EM | Admit: 2018-11-13 | Discharge: 2018-11-13 | Disposition: A | Discharge status: Home / Self Care | Payer: BLUE CROSS/BLUE SHIELD | Attending: Emergency Medicine | Admitting: Emergency Medicine

## 2018-11-13 ENCOUNTER — Emergency Department (HOSPITAL_COMMUNITY): Payer: BLUE CROSS/BLUE SHIELD

## 2018-11-13 DIAGNOSIS — Z79899 Other long term (current) drug therapy: Secondary | ICD-10-CM | POA: Insufficient documentation

## 2018-11-13 DIAGNOSIS — Z8547 Personal history of malignant neoplasm of testis: Secondary | ICD-10-CM | POA: Insufficient documentation

## 2018-11-13 DIAGNOSIS — J111 Influenza due to unidentified influenza virus with other respiratory manifestations: Secondary | ICD-10-CM | POA: Diagnosis not present

## 2018-11-13 DIAGNOSIS — F17210 Nicotine dependence, cigarettes, uncomplicated: Secondary | ICD-10-CM | POA: Diagnosis not present

## 2018-11-13 DIAGNOSIS — B9789 Other viral agents as the cause of diseases classified elsewhere: Secondary | ICD-10-CM | POA: Diagnosis not present

## 2018-11-13 DIAGNOSIS — J069 Acute upper respiratory infection, unspecified: Secondary | ICD-10-CM | POA: Diagnosis not present

## 2018-11-13 DIAGNOSIS — R5383 Other fatigue: Secondary | ICD-10-CM | POA: Diagnosis present

## 2018-11-13 LAB — URINALYSIS, ROUTINE W REFLEX MICROSCOPIC
Bilirubin Urine: NEGATIVE
Glucose, UA: NEGATIVE mg/dL
Ketones, ur: 20 mg/dL — AB
Leukocytes, UA: NEGATIVE
Nitrite: NEGATIVE
Protein, ur: NEGATIVE mg/dL
Specific Gravity, Urine: 1.018 (ref 1.005–1.030)
pH: 5 (ref 5.0–8.0)

## 2018-11-13 LAB — CBC WITH DIFFERENTIAL/PLATELET
Abs Immature Granulocytes: 0.02 10*3/uL (ref 0.00–0.07)
Basophils Absolute: 0 10*3/uL (ref 0.0–0.1)
Basophils Relative: 0 %
Eosinophils Absolute: 0 10*3/uL (ref 0.0–0.5)
Eosinophils Relative: 0 %
HCT: 41 % (ref 39.0–52.0)
Hemoglobin: 14.1 g/dL (ref 13.0–17.0)
Immature Granulocytes: 1 %
Lymphocytes Relative: 28 %
Lymphs Abs: 0.9 10*3/uL (ref 0.7–4.0)
MCH: 31.1 pg (ref 26.0–34.0)
MCHC: 34.4 g/dL (ref 30.0–36.0)
MCV: 90.5 fL (ref 80.0–100.0)
Monocytes Absolute: 0.4 10*3/uL (ref 0.1–1.0)
Monocytes Relative: 12 %
Neutro Abs: 1.9 10*3/uL (ref 1.7–7.7)
Neutrophils Relative %: 59 %
Platelets: 115 10*3/uL — ABNORMAL LOW (ref 150–400)
RBC: 4.53 MIL/uL (ref 4.22–5.81)
RDW: 12.7 % (ref 11.5–15.5)
WBC: 3.2 10*3/uL — ABNORMAL LOW (ref 4.0–10.5)
nRBC: 0 % (ref 0.0–0.2)

## 2018-11-13 LAB — BASIC METABOLIC PANEL
Anion gap: 10 (ref 5–15)
BUN: 19 mg/dL (ref 8–23)
CO2: 23 mmol/L (ref 22–32)
Calcium: 7.7 mg/dL — ABNORMAL LOW (ref 8.9–10.3)
Chloride: 97 mmol/L — ABNORMAL LOW (ref 98–111)
Creatinine, Ser: 1.07 mg/dL (ref 0.61–1.24)
GFR calc Af Amer: >60 mL/min (ref >60)
GFR calc non Af Amer: >60 mL/min (ref >60)
Glucose, Bld: 94 mg/dL (ref 70–99)
Potassium: 4 mmol/L (ref 3.5–5.1)
Sodium: 130 mmol/L — ABNORMAL LOW (ref 135–145)

## 2018-11-13 MED ORDER — GUAIFENESIN ER 1200 MG PO TB12: 1.0000 | ORAL_TABLET | Freq: Two times a day (BID) | ORAL | 0 refills | Status: DC

## 2018-11-13 MED ORDER — PREDNISONE 50 MG PO TABS: 50.0000 mg | ORAL_TABLET | Freq: Every day | ORAL | 0 refills | Status: DC

## 2018-11-13 MED ORDER — SODIUM CHLORIDE 0.9 % IV BOLUS
1000.0000 mL | Freq: Once | INTRAVENOUS | Status: AC
  Administered 2018-11-13: 1000 mL via INTRAVENOUS

## 2018-11-13 MED ORDER — IPRATROPIUM-ALBUTEROL 0.5-2.5 (3) MG/3ML IN SOLN
3.0000 mL | Freq: Once | RESPIRATORY_TRACT | Status: AC
  Administered 2018-11-13: 3 mL via RESPIRATORY_TRACT
  Filled 2018-11-13: qty 3

## 2018-11-13 MED ORDER — DOXYCYCLINE HYCLATE 100 MG PO CAPS: 100.0000 mg | ORAL_CAPSULE | Freq: Two times a day (BID) | ORAL | 0 refills | Status: DC

## 2018-11-13 MED ORDER — ACETAMINOPHEN-CODEINE 120-12 MG/5ML PO SOLN: 10.0000 mL | ORAL | 0 refills | Status: DC | PRN

## 2018-11-13 NOTE — ED Triage Notes (Signed)
Pt c/o being fatigued, no appetite that started 2 weeks ago. denies n/v/d. Reports on Saturday wet pants due to no control.

## 2018-11-13 NOTE — Discharge Instructions (Signed)
Return here as needed.  Follow-up with the primary doctor.  Increase your fluid intake.  Rest as much as possible.

## 2018-11-19 NOTE — ED Provider Notes (Signed)
[facility] DEPT Provider Note   CSN: [id number] Arrival date & time: 11/13/18  1028     History   Chief Complaint Chief Complaint  Patient presents with  . no appetite  . Fatigue    HPI Jonathan Ray is a 63 y.o. male.  HPI Patient presents to the emergency department with cough, general fatigue decreased appetite along with fever.  Patient states that nothing seems to make his condition better or worse.  Patient states he did not take any medications prior to arrival for his symptoms.  The patient denies chest pain, shortness of breath, headache,blurred vision, neck pain, weakness, numbness, dizziness, anorexia, edema, abdominal pain, nausea, vomiting, diarrhea, rash, back pain, dysuria, hematemesis, bloody stool, near syncope, or syncope. Past Medical History:  Diagnosis Date  . Heart murmur   . Irregular heartbeat   . Testicular cancer [facility])     Patient Active Problem List   Diagnosis Date Noted  . Centrilobular emphysema ([place]) 02/08/2018  . Tobacco use disorder 02/08/2018  . Benign head tremor 12/16/2017  . Heart murmur   . 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 MOUTH SURGERY    . SURGERY SCROTAL / TESTICULAR    . TESTICLE REMOVAL          Home Medications    Prior to Admission medications   Medication Sig Start Date End Date Taking? Authorizing Provider  polyvinyl alcohol (LIQUIFILM TEARS) 1.4 % ophthalmic solution Place 1 drop into both eyes as needed for dry eyes.   Yes [provider]  acetaminophen-codeine 120-12 MG/5ML solution Take 10 mLs by mouth every 4 (four) hours as needed for moderate pain (and cough). 11/13/18   Evamaria Detore, Harrell Gave, PA-C  albuterol (PROVENTIL HFA;VENTOLIN HFA) 108 (90 Base) MCG/ACT inhaler Inhale 2 puffs into the lungs every 4 (four) hours as needed for wheezing or shortness of breath (cough, shortness of breath or wheezing.). Patient not taking:  Reported on 11/13/2018 02/06/18   Shawnee Knapp, MD  cetirizine-pseudoephedrine (ZYRTEC-D) 5-120 MG tablet Take 1 tablet by mouth 2 (two) times daily. Patient not taking: Reported on 11/13/2018 02/06/18   Shawnee Knapp, MD  Dextromethorphan-guaiFENesin [facility] DM MAXIMUM STRENGTH) 60-1200 MG TB12 Take 1 tablet by mouth every 12 (twelve) hours. Patient not taking: Reported on 11/13/2018 02/06/18   Shawnee Knapp, MD  doxycycline (VIBRAMYCIN) 100 MG capsule Take 1 capsule (100 mg total) by mouth 2 (two) times daily. 11/13/18   Solstice Lastinger, Harrell Gave, PA-C  Guaifenesin 1200 MG TB12 Take 1 tablet (1,200 mg total) by mouth 2 (two) times daily. 11/13/18   Ygnacio Fecteau, Harrell Gave, PA-C  ipratropium (ATROVENT) 0.03 % nasal spray Place 2 sprays into the nose 4 (four) times daily. Patient not taking: Reported on 11/13/2018 02/06/18   Shawnee Knapp, MD  predniSONE (DELTASONE) 50 MG tablet Take 1 tablet (50 mg total) by mouth daily with breakfast. 11/13/18   Dalia Heading, PA-C    Family History Family History  Problem Relation Age of Onset  . Other Father        eye sight loss  . Healthy Mother   . Cancer Other     Social History Social History   Tobacco Use  . Smoking status: Current Every Day Smoker    Packs/day: 1.00    Years: 40.00    Pack years: 40.00    Types: Cigarettes  . Smokeless tobacco: Never Used  Substance Use Topics  .  Alcohol use: No    Alcohol/week: 0.0 standard drinks    Comment: former   . Drug use: No     Allergies   Penicillins   Review of Systems Review of Systems  All other systems negative except as documented in the HPI. All pertinent positives and negatives as reviewed in the HPI. Physical Exam Updated Vital Signs BP 119/64   Pulse 80   Temp 100.1 F (37.8 C) (Oral)   Resp 16   SpO2 100%   Physical Exam Vitals signs and nursing note reviewed.  Constitutional:      General: He is not in acute distress.    Appearance: He is well-developed.  HENT:     Head:  Normocephalic and atraumatic.  Eyes:     Pupils: Pupils are equal, round, and reactive to light.  Neck:     Musculoskeletal: Normal range of motion and neck supple.  Cardiovascular:     Rate and Rhythm: Normal rate and regular rhythm.     Heart sounds: Normal heart sounds. No murmur. No friction rub. No gallop.   Pulmonary:     Effort: Pulmonary effort is normal. No respiratory distress.     Breath sounds: Normal breath sounds. No wheezing.  Abdominal:     General: Bowel sounds are normal. There is no distension.     Palpations: Abdomen is soft.     Tenderness: There is no abdominal tenderness.  Skin:    General: Skin is warm and dry.     Capillary Refill: Capillary refill takes less than 2 seconds.     Findings: No erythema or rash.  Neurological:     Mental Status: He is alert and oriented to person, place, and time.     Motor: No abnormal muscle tone.     Coordination: Coordination normal.  Psychiatric:        Behavior: Behavior normal.      ED Treatments / Results  Labs (all labs ordered are listed, but only abnormal results are displayed) Labs Reviewed  BASIC METABOLIC PANEL - Abnormal; Notable for the following components:      Result Value   Sodium 130 (*)    Chloride 97 (*)    Calcium 7.7 (*)    All other components within normal limits  CBC WITH DIFFERENTIAL/PLATELET - Abnormal; Notable for the following components:   WBC 3.2 (*)    Platelets 115 (*)    All other components within normal limits  URINALYSIS, ROUTINE W REFLEX MICROSCOPIC - Abnormal; Notable for the following components:   APPearance HAZY (*)    Hgb urine dipstick SMALL (*)    Ketones, ur 20 (*)    Bacteria, UA RARE (*)    All other components within normal limits    EKG EKG Interpretation  Date/Time:  Monday November 13 2018 12:15:51 EST Ventricular Rate:  74 PR Interval:    QRS Duration: 101 QT Interval:  372 QTC Calculation: 413 R Axis:   -23 Text Interpretation:  Sinus rhythm  Borderline left axis deviation Borderline low voltage, extremity leads Baseline wander No significant change since last tracing Borderline ECG Confirmed by Carmin Muskrat [phone]) on 11/13/2018 12:22:30 PM Also confirmed by Carmin Muskrat ([id number]), editor Philomena Doheny [phone])  on 11/13/2018 1:02:49 PM   Radiology No results found.  Procedures Procedures (including critical care time)  Medications Ordered in ED Medications  sodium chloride 0.9 % bolus 1,000 mL (0 mLs Intravenous Stopped 11/13/18 1550)  ipratropium-albuterol (DUONEB) 0.5-2.5 (3) MG/3ML nebulizer  solution 3 mL (3 mLs Nebulization Given 11/13/18 1439)  sodium chloride 0.9 % bolus 1,000 mL (0 mLs Intravenous Stopped 11/13/18 1619)     Initial Impression / Assessment and Plan / ED Course  I have reviewed the triage vital signs and the nursing notes.  Pertinent labs & imaging results that were available during my care of the patient were reviewed by me and considered in my medical decision making (see chart for details).     Patient be treated for an influenza-like illness.  Told to return here as needed.  Patient is advised to follow-up with his primary doctor.  Patient also advised to increase his fluid intake and rest as much as possible.  Final Clinical Impressions(s) / ED Diagnoses   Final diagnoses:  Influenza  Viral URI with cough    ED Discharge Orders         Ordered    Guaifenesin 1200 MG TB12  2 times daily     11/13/18 1606    predniSONE (DELTASONE) 50 MG tablet  Daily with breakfast     11/13/18 1606    doxycycline (VIBRAMYCIN) 100 MG capsule  2 times daily     11/13/18 1606    acetaminophen-codeine 120-12 MG/5ML solution  Every 4 hours PRN     11/13/18 [address] PA-C 11/19/18 1642    Carmin Muskrat, MD 11/19/18 [phone]

## 2019-07-23 ENCOUNTER — Other Ambulatory Visit: Payer: Self-pay | Admitting: *Deleted

## 2019-07-23 DIAGNOSIS — Z122 Encounter for screening for malignant neoplasm of respiratory organs: Secondary | ICD-10-CM

## 2019-07-23 DIAGNOSIS — Z87891 Personal history of nicotine dependence: Secondary | ICD-10-CM

## 2019-07-23 DIAGNOSIS — F17210 Nicotine dependence, cigarettes, uncomplicated: Secondary | ICD-10-CM

## 2019-07-31 ENCOUNTER — Other Ambulatory Visit: Payer: Self-pay

## 2019-07-31 ENCOUNTER — Ambulatory Visit
Admission: RE | Admit: 2019-07-31 | Discharge: 2019-07-31 | Disposition: A | Discharge status: Home / Self Care | Payer: Self-pay | Source: Ambulatory Visit | Attending: Acute Care | Admitting: Acute Care

## 2019-07-31 DIAGNOSIS — F17210 Nicotine dependence, cigarettes, uncomplicated: Secondary | ICD-10-CM

## 2019-07-31 DIAGNOSIS — Z122 Encounter for screening for malignant neoplasm of respiratory organs: Secondary | ICD-10-CM

## 2019-07-31 DIAGNOSIS — Z87891 Personal history of nicotine dependence: Secondary | ICD-10-CM

## 2019-08-01 ENCOUNTER — Telehealth: Payer: Self-pay | Admitting: Acute Care

## 2019-08-01 DIAGNOSIS — F17210 Nicotine dependence, cigarettes, uncomplicated: Secondary | ICD-10-CM

## 2019-08-01 DIAGNOSIS — Z122 Encounter for screening for malignant neoplasm of respiratory organs: Secondary | ICD-10-CM

## 2019-08-01 NOTE — Telephone Encounter (Signed)
Pt informed of CT results per Sarah Groce, NP.  PT verbalized understanding.  Copy sent to PCP.  Order placed for 1 yr f/u CT.  

## 2019-11-16 ENCOUNTER — Other Ambulatory Visit: Payer: Self-pay | Admitting: Internal Medicine

## 2019-11-16 DIAGNOSIS — R1030 Lower abdominal pain, unspecified: Secondary | ICD-10-CM

## 2019-11-26 ENCOUNTER — Inpatient Hospital Stay: Admission: RE | Admit: 2019-11-26 | Payer: Self-pay | Source: Ambulatory Visit

## 2020-01-10 ENCOUNTER — Other Ambulatory Visit: Payer: Self-pay | Admitting: Internal Medicine

## 2020-01-14 ENCOUNTER — Other Ambulatory Visit: Payer: Self-pay | Admitting: Internal Medicine

## 2020-01-14 DIAGNOSIS — Z720 Tobacco use: Secondary | ICD-10-CM

## 2020-02-01 ENCOUNTER — Ambulatory Visit
Admission: RE | Admit: 2020-02-01 | Discharge: 2020-02-01 | Disposition: A | Discharge status: Home / Self Care | Payer: 59 | Source: Ambulatory Visit | Attending: Internal Medicine | Admitting: Internal Medicine

## 2020-02-01 DIAGNOSIS — R1030 Lower abdominal pain, unspecified: Secondary | ICD-10-CM

## 2020-02-01 MED ORDER — IOPAMIDOL (ISOVUE-300) INJECTION 61%
75.0000 mL | Freq: Once | INTRAVENOUS | Status: AC | PRN
  Administered 2020-02-01: 75 mL via INTRAVENOUS

## 2020-03-13 ENCOUNTER — Other Ambulatory Visit: Payer: Self-pay | Admitting: Urology

## 2020-03-26 NOTE — Progress Notes (Addendum)
COVID Vaccine Completed: Yes Date COVID Vaccine completed: 03/26/2020 COVID vaccine manufacturer: Pfizer       PCP - Dr. Georges Mouse Cardiologist - Dr. Edmonia James  Chest x-ray - greater than 1year EKG - greater than 1year Stress Test - greater than 2 years ECHO - greater than 2 years Cardiac Cath - N/A  Sleep Study - N/A CPAP - N/A  Fasting Blood Sugar - N/A Checks Blood Sugar __N/A___ times a day  Blood Thinner Instructions:N/A Aspirin Instructions:N/A Last Dose:N/A  Anesthesia review: N/A  Patient denies shortness of breath, fever, cough and chest pain at PAT appointment   Patient verbalized understanding of instructions that were given to them at the PAT appointment. Patient was also instructed that they will need to review over the PAT instructions again at home before surgery.

## 2020-03-26 NOTE — Patient Instructions (Addendum)
DUE TO COVID-19 ONLY ONE VISITOR ARE ALLOWED TO COME WITH YOU AND STAY IN THE WAITING ROOM ONLY DURING PRE OP AND PROCEDURE. THEN TWO VISITORS MAY VISIT WITH YOU IN YOUR PRIVATE ROOM DURING VISITING HOURS ONLY!!   COVID SWAB TESTING MUST BE COMPLETED ON:  Saturday, April 05, 2020 At 9:30 AM  [address] [place] [facility] enter pre surgical testing line (Must self quarantine after testing. Follow instructions on handout.)             Your procedure is scheduled on: Wednesday, April 09, 2020   Report to [facility] Main  Entrance    Report to admitting at 10:00 AM   Call this number if you have problems the morning of surgery [phone]   Do not eat food or drink liquids :After Midnight.   Oral Hygiene is also important to reduce your risk of infection.                                    Remember - BRUSH YOUR TEETH THE MORNING OF SURGERY WITH YOUR REGULAR TOOTHPASTE   Do NOT smoke after Midnight   Take these medicines the morning of surgery with A SIP OF WATER: None                               You may not have any metal on your body including jewelry, and body piercings             Do not wear lotions, powders, perfumes/cologne, or deodorant                           Men may shave face and neck.   Do not bring valuables to the hospital. [place].   Contacts, dentures or bridgework may not be worn into surgery.   Bring small overnight bag day of surgery.    Special Instructions: Bring a copy of your healthcare power of attorney and living will documents         the day of surgery if you haven't scanned them in before.              Please read over the following fact sheets you were given: IF YOU HAVE QUESTIONS ABOUT YOUR PRE OP INSTRUCTIONS PLEASE CALL [phone]   [place] - Preparing for Surgery Before surgery, you can play an important role.  Because skin is not sterile, your  skin needs to be as free of germs as possible.  You can reduce the number of germs on your skin by washing with CHG (chlorahexidine gluconate) soap before surgery.  CHG is an antiseptic cleaner which kills germs and bonds with the skin to continue killing germs even after washing. Please DO NOT use if you have an allergy to CHG or antibacterial soaps.  If your skin becomes reddened/irritated stop using the CHG and inform your nurse when you arrive at Short Stay. Do not shave (including legs and underarms) for at least 48 hours prior to the first CHG shower.  You may shave your face/neck.  Please follow these instructions carefully:  1.  Shower with CHG Soap the night before surgery and the  morning of surgery.  2.  If you choose to wash your hair, wash your hair first as usual with your normal  shampoo.  3.  After you shampoo, rinse your hair and body thoroughly to remove the shampoo.                             4.  Use CHG as you would any other liquid soap.  You can apply chg directly to the skin and wash.  Gently with a scrungie or clean washcloth.  5.  Apply the CHG Soap to your body ONLY FROM THE NECK DOWN.   Do   not use on face/ open                           Wound or open sores. Avoid contact with eyes, ears mouth and   genitals (private parts).                       Wash face,  Genitals (private parts) with your normal soap.             6.  Wash thoroughly, paying special attention to the area where your    surgery  will be performed.  7.  Thoroughly rinse your body with warm water from the neck down.  8.  DO NOT shower/wash with your normal soap after using and rinsing off the CHG Soap.                9.  Pat yourself dry with a clean towel.            10.  Wear clean pajamas.            11.  Place clean sheets on your bed the night of your first shower and do not  sleep with pets. Day of Surgery : Do not apply any lotions/deodorants the morning of surgery.  Please wear clean clothes to  the hospital/surgery center.  FAILURE TO FOLLOW THESE INSTRUCTIONS MAY RESULT IN THE CANCELLATION OF YOUR SURGERY  PATIENT SIGNATURE_________________________________  NURSE SIGNATURE__________________________________  ________________________________________________________________________

## 2020-03-28 ENCOUNTER — Encounter (HOSPITAL_COMMUNITY): Payer: Self-pay

## 2020-03-28 ENCOUNTER — Encounter (HOSPITAL_COMMUNITY)
Admission: RE | Admit: 2020-03-28 | Discharge: 2020-03-28 | Disposition: A | Discharge status: Home / Self Care | Payer: 59 | Source: Ambulatory Visit | Attending: Urology | Admitting: Urology

## 2020-03-28 ENCOUNTER — Other Ambulatory Visit: Payer: Self-pay

## 2020-03-28 DIAGNOSIS — Z01812 Encounter for preprocedural laboratory examination: Secondary | ICD-10-CM | POA: Insufficient documentation

## 2020-03-28 HISTORY — DX: Neoplasm of unspecified behavior of bladder: D49.4

## 2020-03-28 HISTORY — DX: Gastro-esophageal reflux disease without esophagitis: K21.9

## 2020-03-28 LAB — CBC
HCT: 42.7 % (ref 39.0–52.0)
Hemoglobin: 14.3 g/dL (ref 13.0–17.0)
MCH: 31.8 pg (ref 26.0–34.0)
MCHC: 33.5 g/dL (ref 30.0–36.0)
MCV: 94.9 fL (ref 80.0–100.0)
Platelets: 181 10*3/uL (ref 150–400)
RBC: 4.5 MIL/uL (ref 4.22–5.81)
RDW: 12.6 % (ref 11.5–15.5)
WBC: 4.3 10*3/uL (ref 4.0–10.5)
nRBC: 0 % (ref 0.0–0.2)

## 2020-03-30 LAB — URINE CULTURE: Culture: NO GROWTH

## 2020-04-05 ENCOUNTER — Other Ambulatory Visit (HOSPITAL_COMMUNITY)
Admission: RE | Admit: 2020-04-05 | Discharge: 2020-04-05 | Disposition: A | Discharge status: Home / Self Care | Payer: 59 | Source: Ambulatory Visit | Attending: Urology | Admitting: Urology

## 2020-04-05 DIAGNOSIS — Z20822 Contact with and (suspected) exposure to covid-19: Secondary | ICD-10-CM | POA: Diagnosis not present

## 2020-04-05 DIAGNOSIS — Z01812 Encounter for preprocedural laboratory examination: Secondary | ICD-10-CM | POA: Diagnosis present

## 2020-04-05 LAB — SARS CORONAVIRUS 2 (TAT 6-24 HRS): SARS Coronavirus 2: NEGATIVE

## 2020-04-08 ENCOUNTER — Encounter (HOSPITAL_COMMUNITY): Payer: Self-pay | Admitting: Urology

## 2020-04-09 ENCOUNTER — Inpatient Hospital Stay (HOSPITAL_COMMUNITY): Payer: 59 | Admitting: Anesthesiology

## 2020-04-09 ENCOUNTER — Other Ambulatory Visit: Payer: Self-pay

## 2020-04-09 ENCOUNTER — Inpatient Hospital Stay (HOSPITAL_COMMUNITY)
Admission: RE | Admit: 2020-04-09 | Discharge: 2020-04-10 | DRG: 670 | Disposition: A | Discharge status: Home / Self Care | Payer: 59 | Attending: Urology | Admitting: Urology

## 2020-04-09 ENCOUNTER — Encounter (HOSPITAL_COMMUNITY): Payer: Self-pay | Admitting: Urology

## 2020-04-09 ENCOUNTER — Encounter (HOSPITAL_COMMUNITY)
Admission: RE | Disposition: A | Discharge status: Home / Self Care | Payer: Self-pay | Source: Home / Self Care | Attending: Urology

## 2020-04-09 DIAGNOSIS — C671 Malignant neoplasm of dome of bladder: Principal | ICD-10-CM | POA: Diagnosis present

## 2020-04-09 DIAGNOSIS — R3129 Other microscopic hematuria: Secondary | ICD-10-CM | POA: Diagnosis not present

## 2020-04-09 DIAGNOSIS — D494 Neoplasm of unspecified behavior of bladder: Secondary | ICD-10-CM | POA: Diagnosis present

## 2020-04-09 DIAGNOSIS — Z880 Allergy status to penicillin: Secondary | ICD-10-CM

## 2020-04-09 DIAGNOSIS — Z8547 Personal history of malignant neoplasm of testis: Secondary | ICD-10-CM | POA: Diagnosis not present

## 2020-04-09 DIAGNOSIS — F17210 Nicotine dependence, cigarettes, uncomplicated: Secondary | ICD-10-CM | POA: Diagnosis present

## 2020-04-09 DIAGNOSIS — R3912 Poor urinary stream: Secondary | ICD-10-CM | POA: Diagnosis not present

## 2020-04-09 DIAGNOSIS — N3289 Other specified disorders of bladder: Secondary | ICD-10-CM | POA: Diagnosis present

## 2020-04-09 HISTORY — PX: ROBOT ASSISTED LAPAROSCOPIC COMPLETE CYSTECT ILEAL CONDUIT: SHX5139

## 2020-04-09 LAB — ABO/RH: ABO/RH(D): O POS

## 2020-04-09 LAB — TYPE AND SCREEN
ABO/RH(D): O POS
Antibody Screen: NEGATIVE

## 2020-04-09 SURGERY — CYSTECTOMY, ROBOT-ASSISTED, WITH ILEAL CONDUIT CREATION
Anesthesia: General

## 2020-04-09 MED ORDER — PROPOFOL 10 MG/ML IV BOLUS
INTRAVENOUS | Status: AC
  Filled 2020-04-09: qty 20

## 2020-04-09 MED ORDER — DIPHENHYDRAMINE HCL 50 MG/ML IJ SOLN: 12.5000 mg | Freq: Four times a day (QID) | INTRAMUSCULAR | Status: DC | PRN

## 2020-04-09 MED ORDER — LIDOCAINE HCL 2 % IJ SOLN
INTRAMUSCULAR | Status: AC
  Filled 2020-04-09: qty 20

## 2020-04-09 MED ORDER — LACTATED RINGERS IV SOLN: INTRAVENOUS | Status: DC

## 2020-04-09 MED ORDER — KETOROLAC TROMETHAMINE 30 MG/ML IJ SOLN
30.0000 mg | Freq: Once | INTRAMUSCULAR | Status: AC | PRN
  Administered 2020-04-09: 30 mg via INTRAVENOUS

## 2020-04-09 MED ORDER — EPHEDRINE 5 MG/ML INJ
INTRAVENOUS | Status: AC
  Filled 2020-04-09: qty 20

## 2020-04-09 MED ORDER — WATER FOR IRRIGATION, STERILE IR SOLN
Status: DC | PRN
  Administered 2020-04-09: 1000 mL

## 2020-04-09 MED ORDER — BUPIVACAINE-EPINEPHRINE (PF) 0.5% -1:200000 IJ SOLN
INTRAMUSCULAR | Status: AC
  Filled 2020-04-09: qty 30

## 2020-04-09 MED ORDER — GLYCOPYRROLATE PF 0.2 MG/ML IJ SOSY
PREFILLED_SYRINGE | INTRAMUSCULAR | Status: DC | PRN
  Administered 2020-04-09: .2 mg via INTRAVENOUS

## 2020-04-09 MED ORDER — FENTANYL CITRATE (PF) 250 MCG/5ML IJ SOLN
INTRAMUSCULAR | Status: AC
  Filled 2020-04-09: qty 5

## 2020-04-09 MED ORDER — CIPROFLOXACIN IN D5W 400 MG/200ML IV SOLN
400.0000 mg | Freq: Once | INTRAVENOUS | Status: AC
  Administered 2020-04-09: 400 mg via INTRAVENOUS

## 2020-04-09 MED ORDER — OXYBUTYNIN CHLORIDE ER 5 MG PO TB24
10.0000 mg | ORAL_TABLET | Freq: Every day | ORAL | Status: DC
  Administered 2020-04-09 – 2020-04-10 (×2): 10 mg via ORAL
  Filled 2020-04-09 (×2): qty 2

## 2020-04-09 MED ORDER — MORPHINE SULFATE (PF) 2 MG/ML IV SOLN: 2.0000 mg | INTRAVENOUS | Status: DC | PRN

## 2020-04-09 MED ORDER — CHLORHEXIDINE GLUCONATE 0.12 % MT SOLN
15.0000 mL | Freq: Once | OROMUCOSAL | Status: AC
  Administered 2020-04-09: 15 mL via OROMUCOSAL

## 2020-04-09 MED ORDER — DIPHENHYDRAMINE HCL 12.5 MG/5ML PO ELIX: 12.5000 mg | ORAL_SOLUTION | Freq: Four times a day (QID) | ORAL | Status: DC | PRN

## 2020-04-09 MED ORDER — ONDANSETRON HCL 4 MG/2ML IJ SOLN: 4.0000 mg | INTRAMUSCULAR | Status: DC | PRN

## 2020-04-09 MED ORDER — BUPIVACAINE LIPOSOME 1.3 % IJ SUSP
20.0000 mL | Freq: Once | INTRAMUSCULAR | Status: AC
  Administered 2020-04-09: 20 mL
  Filled 2020-04-09: qty 20

## 2020-04-09 MED ORDER — MIDAZOLAM HCL 2 MG/2ML IJ SOLN
INTRAMUSCULAR | Status: AC
  Filled 2020-04-09: qty 2

## 2020-04-09 MED ORDER — SUGAMMADEX SODIUM 200 MG/2ML IV SOLN
INTRAVENOUS | Status: DC | PRN
  Administered 2020-04-09: 200 mg via INTRAVENOUS

## 2020-04-09 MED ORDER — EPHEDRINE SULFATE-NACL 50-0.9 MG/10ML-% IV SOSY
PREFILLED_SYRINGE | INTRAVENOUS | Status: DC | PRN
  Administered 2020-04-09: 5 mg via INTRAVENOUS
  Administered 2020-04-09 (×2): 10 mg via INTRAVENOUS

## 2020-04-09 MED ORDER — SODIUM CHLORIDE 0.9 % IV SOLN: INTRAVENOUS | Status: DC

## 2020-04-09 MED ORDER — CIPROFLOXACIN IN D5W 400 MG/200ML IV SOLN
INTRAVENOUS | Status: AC
  Filled 2020-04-09: qty 200

## 2020-04-09 MED ORDER — KETOROLAC TROMETHAMINE 30 MG/ML IJ SOLN
INTRAMUSCULAR | Status: AC
  Filled 2020-04-09: qty 1

## 2020-04-09 MED ORDER — OXYCODONE HCL 5 MG/5ML PO SOLN: 5.0000 mg | Freq: Once | ORAL | Status: DC | PRN

## 2020-04-09 MED ORDER — HYDROMORPHONE HCL 1 MG/ML IJ SOLN: 0.2500 mg | INTRAMUSCULAR | Status: DC | PRN

## 2020-04-09 MED ORDER — SODIUM CHLORIDE (PF) 0.9 % IJ SOLN
INTRAMUSCULAR | Status: AC
  Filled 2020-04-09: qty 20

## 2020-04-09 MED ORDER — CLINDAMYCIN PHOSPHATE 900 MG/50ML IV SOLN
900.0000 mg | Freq: Once | INTRAVENOUS | Status: AC
  Administered 2020-04-09: 900 mg via INTRAVENOUS

## 2020-04-09 MED ORDER — OXYCODONE-ACETAMINOPHEN 5-325 MG PO TABS
1.0000 | ORAL_TABLET | ORAL | Status: DC | PRN
  Administered 2020-04-09 – 2020-04-10 (×2): 2 via ORAL
  Filled 2020-04-09 (×2): qty 2

## 2020-04-09 MED ORDER — GLYCOPYRROLATE PF 0.2 MG/ML IJ SOSY
PREFILLED_SYRINGE | INTRAMUSCULAR | Status: AC
  Filled 2020-04-09: qty 1

## 2020-04-09 MED ORDER — EPHEDRINE 5 MG/ML INJ
INTRAVENOUS | Status: AC
  Filled 2020-04-09: qty 10

## 2020-04-09 MED ORDER — LACTATED RINGERS IR SOLN
Status: DC | PRN
  Administered 2020-04-09: 1000 mL

## 2020-04-09 MED ORDER — FENTANYL CITRATE (PF) 100 MCG/2ML IJ SOLN
INTRAMUSCULAR | Status: DC | PRN
  Administered 2020-04-09: 100 ug via INTRAVENOUS
  Administered 2020-04-09 (×2): 50 ug via INTRAVENOUS

## 2020-04-09 MED ORDER — CIPROFLOXACIN IN D5W 400 MG/200ML IV SOLN: 400.0000 mg | Freq: Two times a day (BID) | INTRAVENOUS | Status: DC

## 2020-04-09 MED ORDER — PROPOFOL 10 MG/ML IV BOLUS
INTRAVENOUS | Status: DC | PRN
  Administered 2020-04-09: 130 mg via INTRAVENOUS

## 2020-04-09 MED ORDER — DEXAMETHASONE SODIUM PHOSPHATE 10 MG/ML IJ SOLN
INTRAMUSCULAR | Status: DC | PRN
  Administered 2020-04-09: 10 mg via INTRAVENOUS

## 2020-04-09 MED ORDER — CLINDAMYCIN PHOSPHATE 900 MG/50ML IV SOLN
INTRAVENOUS | Status: AC
  Filled 2020-04-09: qty 50

## 2020-04-09 MED ORDER — SODIUM CHLORIDE 0.9 % IV BOLUS
1000.0000 mL | Freq: Once | INTRAVENOUS | Status: AC
  Administered 2020-04-09: 1000 mL via INTRAVENOUS

## 2020-04-09 MED ORDER — MIDAZOLAM HCL 5 MG/5ML IJ SOLN
INTRAMUSCULAR | Status: DC | PRN
  Administered 2020-04-09: 2 mg via INTRAVENOUS

## 2020-04-09 MED ORDER — LIDOCAINE 2% (20 MG/ML) 5 ML SYRINGE
INTRAMUSCULAR | Status: DC | PRN
  Administered 2020-04-09: 100 mg via INTRAVENOUS
  Administered 2020-04-09: 1.5 mg/kg/h via INTRAVENOUS

## 2020-04-09 MED ORDER — ROCURONIUM BROMIDE 10 MG/ML (PF) SYRINGE
PREFILLED_SYRINGE | INTRAVENOUS | Status: DC | PRN
  Administered 2020-04-09: 10 mg via INTRAVENOUS
  Administered 2020-04-09: 20 mg via INTRAVENOUS
  Administered 2020-04-09: 70 mg via INTRAVENOUS

## 2020-04-09 MED ORDER — ONDANSETRON HCL 4 MG/2ML IJ SOLN
INTRAMUSCULAR | Status: DC | PRN
  Administered 2020-04-09: 4 mg via INTRAVENOUS

## 2020-04-09 MED ORDER — BUPIVACAINE-EPINEPHRINE 0.5% -1:200000 IJ SOLN
INTRAMUSCULAR | Status: DC | PRN
  Administered 2020-04-09: 30 mL

## 2020-04-09 MED ORDER — OXYCODONE HCL 5 MG PO TABS: 5.0000 mg | ORAL_TABLET | Freq: Once | ORAL | Status: DC | PRN

## 2020-04-09 MED ORDER — DOCUSATE SODIUM 100 MG PO CAPS
100.0000 mg | ORAL_CAPSULE | Freq: Two times a day (BID) | ORAL | Status: DC
  Administered 2020-04-09 – 2020-04-10 (×2): 100 mg via ORAL
  Filled 2020-04-09 (×2): qty 1

## 2020-04-09 MED ORDER — STERILE WATER FOR INJECTION IJ SOLN
INTRAMUSCULAR | Status: DC | PRN
  Administered 2020-04-09: 3000 mL

## 2020-04-09 MED ORDER — ORAL CARE MOUTH RINSE: 15.0000 mL | Freq: Once | OROMUCOSAL | Status: AC

## 2020-04-09 MED ORDER — ACETAMINOPHEN 325 MG PO TABS: 650.0000 mg | ORAL_TABLET | ORAL | Status: DC | PRN

## 2020-04-09 SURGICAL SUPPLY — 103 items
ADH SKN CLS APL DERMABOND .7 (GAUZE/BANDAGES/DRESSINGS) ×2
APL ESCP 34 STRL LF DISP (HEMOSTASIS)
APL PRP STRL LF DISP 70% ISPRP (MISCELLANEOUS) ×1
APL SWBSTK 6 STRL LF DISP (MISCELLANEOUS) ×1
APPLICATOR COTTON TIP 6 STRL (MISCELLANEOUS) ×1 IMPLANT
APPLICATOR COTTON TIP 6IN STRL (MISCELLANEOUS) ×2 IMPLANT
APPLICATOR SURGIFLO ENDO (HEMOSTASIS) IMPLANT
BAG LAPAROSCOPIC 12 15 PORT 16 (BASKET) ×1 IMPLANT
BAG RETRIEVAL 12/15 (BASKET)
BAG URO CATCHER STRL LF (MISCELLANEOUS) ×1 IMPLANT
BLADE SURG SZ10 CARB STEEL (BLADE) IMPLANT
CATH FOLEY 2WAY SLVR  5CC 20FR (CATHETERS) ×2
CATH FOLEY 2WAY SLVR 5CC 20FR (CATHETERS) IMPLANT
CELLS DAT CNTRL 66122 CELL SVR (MISCELLANEOUS) IMPLANT
CHLORAPREP W/TINT 26 (MISCELLANEOUS) ×2 IMPLANT
CLIP SUT LAPRA TY ABSORB (SUTURE) ×1 IMPLANT
CLIP VESOLOCK LG 6/CT PURPLE (CLIP) ×2 IMPLANT
CLIP VESOLOCK MED LG 6/CT (CLIP) ×1 IMPLANT
CLIP VESOLOCK XL 6/CT (CLIP) ×2 IMPLANT
CNTNR URN SCR LID CUP LEK RST (MISCELLANEOUS) ×1 IMPLANT
CONT SPEC 4OZ STRL OR WHT (MISCELLANEOUS)
COVER MAYO STAND STRL (DRAPES) ×3 IMPLANT
COVER SURGICAL LIGHT HANDLE (MISCELLANEOUS) ×2 IMPLANT
COVER TIP SHEARS 8 DVNC (MISCELLANEOUS) ×1 IMPLANT
COVER TIP SHEARS 8MM DA VINCI (MISCELLANEOUS) ×2
COVER WAND RF STERILE (DRAPES) IMPLANT
DECANTER SPIKE VIAL GLASS SM (MISCELLANEOUS) ×2 IMPLANT
DERMABOND ADVANCED (GAUZE/BANDAGES/DRESSINGS) ×2
DERMABOND ADVANCED .7 DNX12 (GAUZE/BANDAGES/DRESSINGS) ×2 IMPLANT
DRAPE ARM DVNC X/XI (DISPOSABLE) ×4 IMPLANT
DRAPE COLUMN DVNC XI (DISPOSABLE) ×1 IMPLANT
DRAPE DA VINCI XI ARM (DISPOSABLE) ×8
DRAPE DA VINCI XI COLUMN (DISPOSABLE) ×2
DRSG TEGADERM 4X4.75 (GAUZE/BANDAGES/DRESSINGS) IMPLANT
ELECT REM PT RETURN 15FT ADLT (MISCELLANEOUS) ×2 IMPLANT
GAUZE 4X4 16PLY RFD (DISPOSABLE) IMPLANT
GLOVE BIO SURGEON STRL SZ 6.5 (GLOVE) ×5 IMPLANT
GLOVE BIOGEL M STRL SZ7.5 (GLOVE) ×5 IMPLANT
GLOVE BIOGEL PI IND STRL 7.5 (GLOVE) ×1 IMPLANT
GLOVE BIOGEL PI INDICATOR 7.5 (GLOVE)
GOWN STRL REUS W/TWL LRG LVL3 (GOWN DISPOSABLE) ×10 IMPLANT
GOWN STRL REUS W/TWL XL LVL3 (GOWN DISPOSABLE) ×1 IMPLANT
IRRIG SUCT STRYKERFLOW 2 WTIP (MISCELLANEOUS) ×2
IRRIGATION SUCT STRKRFLW 2 WTP (MISCELLANEOUS) ×1 IMPLANT
KIT PROCEDURE DA VINCI SI (MISCELLANEOUS)
KIT PROCEDURE DVNC SI (MISCELLANEOUS) IMPLANT
KIT TURNOVER KIT A (KITS) IMPLANT
LOOP CUT BIPOLAR 24F LRG (ELECTROSURGICAL) IMPLANT
LOOP VESSEL MAXI BLUE (MISCELLANEOUS) ×1 IMPLANT
MANIFOLD NEPTUNE II (INSTRUMENTS) ×2 IMPLANT
NDL ASPIRATION 22 (NEEDLE) ×1 IMPLANT
NDL INSUFFLATION 14GA 120MM (NEEDLE) ×1 IMPLANT
NDL SAFETY ECLIPSE 18X1.5 (NEEDLE) ×1 IMPLANT
NEEDLE ASPIRATION 22 (NEEDLE) IMPLANT
NEEDLE HYPO 18GX1.5 SHARP (NEEDLE)
NEEDLE INSUFFLATION 14GA 120MM (NEEDLE) IMPLANT
OBTURATOR OPTICAL STANDARD 8MM (TROCAR) ×2
OBTURATOR OPTICAL STND 8 DVNC (TROCAR) ×1
OBTURATOR OPTICALSTD 8 DVNC (TROCAR) IMPLANT
PACK ROBOTIC CUSTOM UROLOGY (CUSTOM PROCEDURE TRAY) ×2 IMPLANT
PAD POSITIONING PINK XL (MISCELLANEOUS) ×2 IMPLANT
PENCIL SMOKE EVACUATOR (MISCELLANEOUS) IMPLANT
RELOAD STAPLE 60 2.6 WHT THN (STAPLE) ×3 IMPLANT
RELOAD STAPLER WHITE 60MM (STAPLE) ×3 IMPLANT
RETRACTOR LONRSTAR 16.6X16.6CM (MISCELLANEOUS) IMPLANT
RETRACTOR STER APS 16.6X16.6CM (MISCELLANEOUS)
RETRACTOR WND ALEXIS 18 MED (MISCELLANEOUS) ×1 IMPLANT
RTRCTR WOUND ALEXIS 18CM MED (MISCELLANEOUS)
SEAL CANN UNIV 5-8 DVNC XI (MISCELLANEOUS) ×4 IMPLANT
SEAL XI 5MM-8MM UNIVERSAL (MISCELLANEOUS) ×8
SET CYSTO W/LG BORE CLAMP LF (SET/KITS/TRAYS/PACK) ×1 IMPLANT
SET IRRIG Y TYPE TUR BLADDER L (SET/KITS/TRAYS/PACK) ×2 IMPLANT
SOLUTION ELECTROLUBE (MISCELLANEOUS) ×2 IMPLANT
SPONGE LAP 18X18 RF (DISPOSABLE) ×2 IMPLANT
SPONGE LAP 4X18 RFD (DISPOSABLE) ×1 IMPLANT
STAPLER ECHELON LONG 60 440 (INSTRUMENTS) ×1 IMPLANT
STAPLER RELOAD WHITE 60MM (STAPLE) ×6
STENT SET URETHERAL RIGHT 7FR (STENTS) ×1 IMPLANT
SUT CHROMIC 4 0 RB 1X27 (SUTURE) ×1 IMPLANT
SUT ETHILON 3 0 PS 1 (SUTURE) ×1 IMPLANT
SUT MNCRL AB 4-0 PS2 18 (SUTURE) ×4 IMPLANT
SUT PDS AB 0 CTX 36 PDP370T (SUTURE) ×3 IMPLANT
SUT SILK 3 0 SH 30 (SUTURE) IMPLANT
SUT V-LOC BARB 180 2/0GR6 GS22 (SUTURE)
SUT VIC AB 2-0 CT1 27 (SUTURE)
SUT VIC AB 2-0 CT1 27XBRD (SUTURE) ×1 IMPLANT
SUT VIC AB 2-0 SH 18 (SUTURE) IMPLANT
SUT VIC AB 2-0 UR5 27 (SUTURE) ×4 IMPLANT
SUT VIC AB 3-0 SH 27 (SUTURE) ×6
SUT VIC AB 3-0 SH 27X BRD (SUTURE) ×2 IMPLANT
SUT VIC AB 3-0 SH 27XBRD (SUTURE) ×4 IMPLANT
SUT VIC AB 4-0 RB1 27 (SUTURE)
SUT VIC AB 4-0 RB1 27XBRD (SUTURE) ×4 IMPLANT
SUT VLOC BARB 180 ABS3/0GR12 (SUTURE) ×2
SUTURE V-LC BRB 180 2/0GR6GS22 (SUTURE) IMPLANT
SUTURE VLOC BRB 180 ABS3/0GR12 (SUTURE) ×1 IMPLANT
SYR TOOMEY IRRIG 70ML (MISCELLANEOUS)
SYRINGE TOOMEY IRRIG 70ML (MISCELLANEOUS) IMPLANT
SYSTEM UROSTOMY GENTLE TOUCH (WOUND CARE) ×1 IMPLANT
TOWEL OR NON WOVEN STRL DISP B (DISPOSABLE) ×2 IMPLANT
TROCAR XCEL NON-BLD 5MMX100MML (ENDOMECHANICALS) IMPLANT
TUBING CONNECTING 10 (TUBING) ×1 IMPLANT
WATER STERILE IRR 1000ML POUR (IV SOLUTION) ×2 IMPLANT

## 2020-04-09 NOTE — H&P (Signed)
CC/HPI: cc: possible bladder lesion   03/06/20: 64 yo man with a history of a right undescended testis who underwent a CT of the pelvis due to bilateral inguinal pain found to have possible bladder lesion on the anterior wall. Patient has a history of testicular cancer and underwent an open left radical orchiectomy by Dr. [name] in 2001. He has a history of undescended testis and underwent surgery in 1962 as well as 1964. Patient has a significant tobacco history and currently smokes 1 pack a day. He has been smoking for 45 years and previously smoked more than a pack a day when he was driving. He also reports a weak urinary stream and nocturia. He denies any gross hematuria or kidney stones.     ALLERGIES: Penicillin    MEDICATIONS: None   GU PSH: None   NON-GU PSH: None   GU PMH: None     PMH Notes:  1898-10-11 00:00:00 - Note: Normal Routine History And Physical Adult   NON-GU PMH: None   FAMILY HISTORY: None   SOCIAL HISTORY: Marital Status: Married Preferred Language: English; Ethnicity: Not Hispanic Or Latino; Race: White Current Smoking Status: Patient smokes.   Tobacco Use Assessment Completed: Used Tobacco in last 30 days? Has never drank.  Drinks 3 caffeinated drinks per day.    REVIEW OF SYSTEMS:    GU Review Male:   Patient denies frequent urination, hard to postpone urination, burning/ pain with urination, get up at night to urinate, leakage of urine, stream starts and stops, trouble starting your stream, have to strain to urinate , erection problems, and penile pain.  Gastrointestinal (Upper):   Patient denies nausea, vomiting, and indigestion/ heartburn.  Gastrointestinal (Lower):   Patient reports constipation. Patient denies diarrhea.  Constitutional:   Patient reports fatigue. Patient denies night sweats, weight loss, and fever.  Skin:   Patient denies skin rash/ lesion and itching.  Eyes:   Patient denies blurred vision and double vision.  Ears/ Nose/  Throat:   Patient denies sore throat and sinus problems.  Hematologic/Lymphatic:   Patient denies swollen glands and easy bruising.  Cardiovascular:   Patient denies leg swelling and chest pains.  Respiratory:   Patient denies cough and shortness of breath.  Endocrine:   Patient denies excessive thirst.  Musculoskeletal:   Patient denies back pain and joint pain.  Neurological:   Patient denies headaches and dizziness.  Psychologic:   Patient denies depression and anxiety.   VITAL SIGNS:      03/06/2020 01:08 PM  Weight 168 lb / 76.2 kg  Height 73 in / 185.42 cm  BP 133/75 mmHg  Pulse 73 /min  Temperature 97.1 F / 36.1 C  BMI 22.2 kg/m   MULTI-SYSTEM PHYSICAL EXAMINATION:    Constitutional: Well-nourished. No physical deformities. Normally developed. Good grooming.  Neck: Neck symmetrical, not swollen. Normal tracheal position.  Respiratory: No labored breathing, no use of accessory muscles.   Cardiovascular: Normal temperature  Skin: No paleness, no jaundice, no cyanosis. No lesion, no ulcer, no rash.  Neurologic / Psychiatric: Oriented to time, oriented to place, oriented to person. No depression, no anxiety, no agitation.  Gastrointestinal: No rigidity, non obese abdomen.   Eyes: Normal conjunctivae. Normal eyelids.  Ears, Nose, Mouth, and Throat: Left ear no scars, no lesions, no masses. Right ear no scars, no lesions, no masses. Nose no scars, no lesions, no masses. Normal hearing. Normal lips.  Musculoskeletal: Normal gait and station of head and neck.  Complexity of Data:  Records Review:   POC Tool  Urine Test Review:   Urinalysis  X-Ray Review: C.T. Pelvis: Reviewed Films. Reviewed Report. Discussed With Patient.     PROCEDURES:         Flexible Cystoscopy - 52000  Risks, benefits, and some of the potential complications of the procedure were discussed at length with the patient including infection, bleeding, voiding discomfort, urinary retention, fever, chills,  sepsis, and others. All questions were answered. Informed consent was obtained. Sterile technique and intraurethral analgesia were used.  Meatus:  Normal size. Normal location. Normal condition.  Urethra:  No strictures.  External Sphincter:  Normal.  Verumontanum:  Normal.  Prostate:  Borderline obstructing lateral lobes. No hyperplasia.  Bladder Neck:  Non-obstructing.  Ureteral Orifices:  Normal location. Normal size. Normal shape. Effluxed clear urine.  Bladder:  No trabeculation. No stones. Right lateral inferior diverticulum without masses. 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      The lower urinary tract was carefully examined. The procedure was well-tolerated and without complications. Antibiotic instructions were given. Instructions were given to call the office immediately for bloody urine, difficulty urinating, urinary retention, painful or frequent urination, fever, chills, nausea, vomiting or other illness. The patient stated that he understood these instructions and would comply with them.         Urinalysis Dipstick Dipstick Cont'd  Color: Yellow Bilirubin: Neg mg/dL  Appearance: Clear Ketones: Neg mg/dL  Specific Gravity: 1.010 Blood: Neg ery/uL  pH: 7.0 Protein: Neg mg/dL  Glucose: Neg mg/dL Urobilinogen: 0.2 mg/dL    Nitrites: Neg    Leukocyte Esterase: Neg leu/uL    ASSESSMENT:      ICD-10 Details  1 GU:   Bladder tumor/neoplasm - D41.4 Undiagnosed New Problem - Based on cystoscopy findings of a bladder mass emanating from to diverticulum patient will need a partial cystectomy.  2   Weak Urinary Stream - Z61.09 Acute, Uncomplicated  3   History of testicular cancer - Z85.47 Chronic, Stable   PLAN:           Document Letter(s):  Created for Patient: Clinical Summary         Notes:   Based on location of tumor excising it robotically from the dome is the safest option to avoid perforation during TURBT. I discussed proceeding with a  robotic assisted laparoscopic partial cystectomy with the patient. We discussed the risks and benefits of the procedure including bleeding, infection, need for Foley catheter, leak, bladder pain, damage to surrounding structures, need for future treatment/intervention. This surgery will be scheduled with Dr. Louis Meckel and Dr. Quinnlan Abruzzo assisting. cc: Jani Gravel, MD

## 2020-04-09 NOTE — Op Note (Addendum)
Preoperative diagnosis:  1. Bladder tumor  Postoperative diagnosis:  1. same   Procedure: 1. Robotic assisted laparoscopic partial cystectomy  Surgeon: [name], MD  First Assistant: Jacalyn Lefevre, MD; An assistant was required for this surgical procedure.  The duties of the assistant included but were not limited to suctioning, passing suture, camera manipulation, retraction. This procedure would not be able to be performed without an Environmental consultant.  Anesthesia: General  Complications: None  Intraoperative findings:  1. Cystoscopy revealed bladder mass at dome 2. Orthotopic ureteral orifices  EBL: Minimal  Specimens:  #1.  Urachus #2.  Bladder mass  Indication: 64 year old man found to have bladder mass on cystoscopy that was emanating from small diverticulum at the dome.  After reviewing the management options for treatment, he elected to proceed with partial cystectomy to remove the tumor. We have discussed the potential benefits and risks of the procedure, side effects of the proposed treatment, the likelihood of the patient achieving the goals of the procedure, and any potential problems that might occur during the procedure or recuperation. Informed consent has been obtained.  Description of procedure: An assistant was required for this surgical procedure.  The duties of the assistant included but were not limited to suctioning, passing suture, camera manipulation, retraction. This procedure would not be able to be performed without an Environmental consultant.  The patient was consented in the preoperative holding area. He was in brought back to the operating room placed the table in supine position. General anesthesia was then induced and endotracheal tube was inserted. He was then placed in dorsolithotomy position.   He was then prepped and draped in the routine sterile fashion.  A time out was performed with the attending present.   Flexible cystoscopy then took place.  Patient  had a normal anterior posterior urethra. The tumor was seen at the dome and appeared papillary with nodular growth at base.  The patient was then placed in steep Trendelenburg. We, the first assistant and I, then began by making a 10 mm incision supraumbilical midline incision the skin down through into the peritoneum. Then placed a 8 mm trocar. I then inflated the abdomen and inserted the 0 robotic lens. We then placed 2 additional a 8 millimeter trochars in the patient's right lower abdomen proximally 9 cm apart and 2 trochars on the patient's left lower abdomen, one was an 8 mm trocar and the one most lateral was a 12 mm trocar which was used as the assistant port. A 5 mm trocar was placed by triangulating the 2 left lateral ports as a second assistant port. These ports were all placed under visual guidance. Once the ports were noted to be satisfactory position the robot was docked. We started with the 0 lens, monopolar scissors in the right hand and the [place] forceps the left hand as well as a fenestrated grasper as the third arm on the left-hand side.   We, the first assistant and I,  began our dissection by marking the bladder mass.  This was done by having the flexible cystoscope helping to identify the lateral margins of the tumor.  At the same time, the bladder was then scored with cautery.  After the entire bladder lesion was marked, it was dissected with a combination of scissors and cautery.  The urachal remenant was also dissected and sent as a separate specimen.  The bladder was then closed in 2 layers with a running 3-0 Vicryl suture on mucosa and a V-Loc suture as  a second layer.  A 20 French Foley catheter was placed in the bladder and the bladder was filled to approximately 200 mL.  No leak was noted.  The bladder was then decompressed and the Foley remained in place.  The port sites were then removed under direct visualization.  A [place] was used to close the 12 mm assistant port  site.  The bladder tumor that had been previously bag was then removed through the midline port incision.  The midline port incision fascia was closed with 0 Vicryl.  All port sites were then closed with 4-0 Monocryl and Dermabond.   At the end of the case all laps needles and sponges had been accounted for. There no immediate complications. The patient returned to the PACU in stable condition.

## 2020-04-09 NOTE — Anesthesia Postprocedure Evaluation (Signed)
Anesthesia Post Note  Patient: WLLIAM GROSSO  Procedure(s) Performed: XI ROBOTIC ASSISTED LAPAROSCOPIC PARTIAL  CYSTECTOMY FLEXIBLE CYSTOSCOPY (N/A )     Patient location during evaluation: PACU Anesthesia Type: General Level of consciousness: awake and alert Pain management: pain level controlled Vital Signs Assessment: post-procedure vital signs reviewed and stable Respiratory status: spontaneous breathing, nonlabored ventilation, respiratory function stable and patient connected to nasal cannula oxygen Cardiovascular status: blood pressure returned to baseline and stable Postop Assessment: no apparent nausea or vomiting Anesthetic complications: no   No complications documented.  Last Vitals:  Vitals:   04/09/20 1615 04/09/20 1630  BP: 124/67 125/73  Pulse: 65 66  Resp: 13 19  Temp: (!) 35.8 C   SpO2: 100% 95%    Last Pain:  Vitals:   04/09/20 1630  TempSrc:   PainSc: Asleep                 Jerrie Gullo S

## 2020-04-09 NOTE — Transfer of Care (Signed)
Immediate Anesthesia Transfer of Care Note  Patient: Jonathan Ray  Procedure(s) Performed: XI ROBOTIC ASSISTED LAPAROSCOPIC PARTIAL  CYSTECTOMY FLEXIBLE CYSTOSCOPY (N/A )  Patient Location: PACU  Anesthesia Type:General  Level of Consciousness: awake, drowsy, patient cooperative and responds to stimulation  Airway & Oxygen Therapy: Patient Spontanous Breathing, Patient connected to face mask oxygen and non-rebreather face mask  Post-op Assessment: Report given to RN and Post -op Vital signs reviewed and unstable, Anesthesiologist notified  Post vital signs: Reviewed and stable  Last Vitals:  Vitals Value Taken Time  BP 119/63 04/09/20 1523  Temp    Pulse 71 04/09/20 1525  Resp 15 04/09/20 1525  SpO2 100 % 04/09/20 1525  Vitals shown include unvalidated device data.  Last Pain:  Vitals:   04/09/20 1038  TempSrc:   PainSc: 0-No pain         Complications: No complications documented.

## 2020-04-09 NOTE — Interval H&P Note (Signed)
History and Physical Interval Note:  04/09/2020 11:24 AM  Jonathan Ray  has presented today for surgery, with the diagnosis of BLADDER TUMOR.  The various methods of treatment have been discussed with the patient and family. After consideration of risks, benefits and other options for treatment, the patient has consented to  Procedure(s): XI ROBOTIC ASSISTED LAPAROSCOPIC PARTIAL  CYSTECTOMY CYSTOSCOPY POSSIBLE TRANSURETHRAL RESECTION OF BLADDER TUMOR (N/A) TRANSURETHRAL RESECTION OF BLADDER TUMOR (TURBT) (N/A) as a surgical intervention.  The patient's history has been reviewed, patient examined, no change in status, stable for surgery.  I have reviewed the patient's chart and labs.  Questions were answered to the patient's satisfaction.     [name]

## 2020-04-09 NOTE — Anesthesia Preprocedure Evaluation (Signed)
Anesthesia Evaluation  Patient identified by MRN, date of birth, ID band Patient awake    Reviewed: Allergy & Precautions, NPO status , Patient's Chart, lab work & pertinent test results  Airway Mallampati: II  TM Distance: >3 FB Neck ROM: Full    Dental no notable dental hx.    Pulmonary Current Smoker,    Pulmonary exam normal breath sounds clear to auscultation       Cardiovascular negative cardio ROS Normal cardiovascular exam Rhythm:Regular Rate:Normal     Neuro/Psych negative neurological ROS  negative psych ROS   GI/Hepatic Neg liver ROS, GERD  ,  Endo/Other  negative endocrine ROS  Renal/GU negative Renal ROS  negative genitourinary   Musculoskeletal negative musculoskeletal ROS (+)   Abdominal   Peds negative pediatric ROS (+)  Hematology negative hematology ROS (+)   Anesthesia Other Findings   Reproductive/Obstetrics negative OB ROS                             Anesthesia Physical Anesthesia Plan  ASA: II  Anesthesia Plan: General   Post-op Pain Management:    Induction: Intravenous  PONV Risk Score and Plan: 2 and Ondansetron, Dexamethasone and Treatment may vary due to age or medical condition  Airway Management Planned: Oral ETT  Additional Equipment:   Intra-op Plan:   Post-operative Plan: Extubation in OR  Informed Consent: I have reviewed the patients History and Physical, chart, labs and discussed the procedure including the risks, benefits and alternatives for the proposed anesthesia with the patient or authorized representative who has indicated his/her understanding and acceptance.     Dental advisory given  Plan Discussed with: CRNA and Surgeon  Anesthesia Plan Comments:         Anesthesia Quick Evaluation

## 2020-04-09 NOTE — Plan of Care (Signed)

## 2020-04-10 ENCOUNTER — Encounter (HOSPITAL_COMMUNITY): Payer: Self-pay | Admitting: Urology

## 2020-04-10 LAB — BASIC METABOLIC PANEL
Anion gap: 9 (ref 5–15)
BUN: 7 mg/dL — ABNORMAL LOW (ref 8–23)
CO2: 26 mmol/L (ref 22–32)
Calcium: 8.5 mg/dL — ABNORMAL LOW (ref 8.9–10.3)
Chloride: 105 mmol/L (ref 98–111)
Creatinine, Ser: 0.71 mg/dL (ref 0.61–1.24)
GFR calc Af Amer: >60 mL/min (ref >60)
GFR calc non Af Amer: >60 mL/min (ref >60)
Glucose, Bld: 119 mg/dL — ABNORMAL HIGH (ref 70–99)
Potassium: 4.6 mmol/L (ref 3.5–5.1)
Sodium: 140 mmol/L (ref 135–145)

## 2020-04-10 LAB — CBC
HCT: 35.4 % — ABNORMAL LOW (ref 39.0–52.0)
Hemoglobin: 12.2 g/dL — ABNORMAL LOW (ref 13.0–17.0)
MCH: 32.1 pg (ref 26.0–34.0)
MCHC: 34.5 g/dL (ref 30.0–36.0)
MCV: 93.2 fL (ref 80.0–100.0)
Platelets: 147 10*3/uL — ABNORMAL LOW (ref 150–400)
RBC: 3.8 MIL/uL — ABNORMAL LOW (ref 4.22–5.81)
RDW: 12.4 % (ref 11.5–15.5)
WBC: 8.7 10*3/uL (ref 4.0–10.5)
nRBC: 0 % (ref 0.0–0.2)

## 2020-04-10 MED ORDER — CHLORHEXIDINE GLUCONATE CLOTH 2 % EX PADS: 6.0000 | MEDICATED_PAD | Freq: Every day | CUTANEOUS | Status: DC

## 2020-04-10 MED ORDER — HYDROCODONE-ACETAMINOPHEN 5-325 MG PO TABS: 1.0000 | ORAL_TABLET | ORAL | 0 refills | Status: DC | PRN

## 2020-04-10 MED ORDER — DOCUSATE SODIUM 100 MG PO CAPS: 100.0000 mg | ORAL_CAPSULE | Freq: Two times a day (BID) | ORAL | 0 refills | Status: AC

## 2020-04-10 MED ORDER — OXYBUTYNIN CHLORIDE ER 10 MG PO TB24: 10.0000 mg | ORAL_TABLET | Freq: Every day | ORAL | 0 refills | Status: DC

## 2020-04-10 MED ORDER — CIPROFLOXACIN HCL 500 MG PO TABS: 500.0000 mg | ORAL_TABLET | Freq: Once | ORAL | 0 refills | Status: AC

## 2020-04-10 NOTE — Discharge Instructions (Signed)
Activity:  You are encouraged to ambulate frequently (about every hour during waking hours) to help prevent blood clots from forming in your legs or lungs.  However, you should not engage in any heavy lifting (> 10-15 lbs), strenuous activity, or straining.   Diet: Eat like you are recovering from the flu until you pass gas.  It will be normal to have some bloating, nausea, and abdominal discomfort intermittently.   Prescriptions:  You will be provided a prescription for pain medication to take as needed.  If your pain is not severe enough to require the prescription pain medication, you may take extra strength Tylenol instead which will have less side effects.  You should also take a prescribed stool softener to avoid straining with bowel movements as the prescription pain medication may constipate you.   Incisions: You may remove your dressing bandages 48 hours after surgery if not removed in the hospital.  You will either have some small staples or special tissue glue at each of the incision sites. Once the bandages are removed (if present), the incisions may stay open to air.  You may start showering (but not soaking or bathing in water) the 2nd day after surgery and the incisions simply need to be patted dry after the shower.  No additional care is needed.   What to call us about: You should call the office [phone]) if you develop fever > 101 or develop persistent vomiting. Activity:  You are encouraged to ambulate frequently (about every hour during waking hours) to help prevent blood clots from forming in your legs or lungs.  However, you should not engage in any heavy lifting (> 10-15 lbs), strenuous activity, or straining.      Foley Catheter Care A soft, flexible tube (Foley catheter) may have been placed in your bladder to drain urine and fluid. Follow these instructions: Taking Care of the Catheter  Keep the area where the catheter leaves your body clean.   Attach the  catheter to the leg so there is no tension on the catheter.   Keep the drainage bag below the level of the bladder, but keep it OFF the floor.   Do not take long soaking baths. Your caregiver will give instructions about showering.   Wash your hands before touching ANYTHING related to the catheter or bag.   Using mild soap and warm water on a washcloth:   Clean the area closest to the catheter insertion site using a circular motion around the catheter.   Clean the catheter itself by wiping AWAY from the insertion site for several inches down the tube.   NEVER wipe upward as this could sweep bacteria up into the urethra (tube in your body that normally drains the bladder) and cause infection.   Place a small amount of sterile lubricant at the tip of the penis where the catheter is entering.  Taking Care of the Drainage Bags  Two drainage bags may be taken home: a large overnight drainage bag, and a smaller leg bag which fits underneath clothing.   It is okay to wear the overnight bag at any time, but NEVER wear the smaller leg bag at night.   Keep the drainage bag well below the level of your bladder. This prevents backflow of urine into the bladder and allows the urine to drain freely.   Anchor the tubing to your leg to prevent pulling or tension on the catheter. Use tape or a leg strap provided by the hospital.  Empty the drainage bag when it is 1/2 to 3/4 full. Wash your hands before and after touching the bag.   Periodically check the tubing for kinks to make sure there is no pressure on the tubing which could restrict the flow of urine.  Changing the Drainage Bags  Cleanse both ends of the clean bag with alcohol before changing.   Pinch off the rubber catheter to avoid urine spillage during the disconnection.   Disconnect the dirty bag and connect the clean one.   Empty the dirty bag carefully to avoid a urine spill.   Attach the new bag to the leg with tape or a leg  strap.  Cleaning the Drainage Bags  Whenever a drainage bag is disconnected, it must be cleaned quickly so it is ready for the next use.   Wash the bag in warm, soapy water.   Rinse the bag thoroughly with warm water.   Soak the bag for 30 minutes in a solution of white vinegar and water (1 cup vinegar to 1 quart warm water).   Rinse with warm water.  SEEK MEDICAL CARE IF:   You have chills or night sweats.   You are leaking around your catheter or have problems with your catheter. It is not uncommon to have sporadic leakage around your catheter as a result of bladder spasms. If the leakage stops, there is not much need for concern. If you are uncertain, call your caregiver.   You develop side effects that you think are coming from your medicines.  SEEK IMMEDIATE MEDICAL CARE IF:   You are suddenly unable to urinate. Check to see if there are any kinks in the drainage tubing that may cause this. If you cannot find any kinks, call your caregiver immediately. This is an emergency.   You develop shortness of breath or chest pains.   Bleeding persists or clots develop in your urine.   You have a fever.   You develop pain in your back or over your lower belly (abdomen).   You develop pain or swelling in your legs.   Any problems you are having get worse rather than better.  MAKE SURE YOU:   Understand these instructions.   Will watch your condition.   Will get help right away if you are not doing well or get worse.

## 2020-04-10 NOTE — Progress Notes (Signed)
Went over discharge papers with patient.  All questions answered.  VSS.  Education with teach back completed.  Patient stated he did not need to learn about the leg bag because he wanted to stay in large bag until foley removed.

## 2020-04-10 NOTE — Discharge Summary (Signed)
Date of admission: 04/09/2020  Date of discharge: 04/10/2020  Admission diagnosis: bladder mass  Discharge diagnosis: bladder mass  Secondary diagnoses:  Patient Active Problem List   Diagnosis Date Noted  . Bladder mass 04/09/2020  . Centrilobular emphysema ([place]) 02/08/2018  . Tobacco use disorder 02/08/2018  . Benign head tremor 12/16/2017  . Heart murmur   . Testicular cancer ([place])     Procedures performed: Procedure(s): XI ROBOTIC ASSISTED LAPAROSCOPIC PARTIAL  CYSTECTOMY FLEXIBLE CYSTOSCOPY  History and Physical: For full details, please see admission history and physical. Briefly, Jonathan Ray is a 64 y.o. year old patient who presented with microscopic hematuria found to have bladder mass on cystoscopy.   Hospital Course: Patient tolerated the procedure well.  He was then transferred to the floor after an uneventful PACU stay.  His hospital course was uncomplicated.  On POD#1 he had met discharge criteria: was tolerating PO, was up and ambulating independently,  pain was well controlled, and was ready to for discharge.   Laboratory values:  Recent Labs    04/10/20 0528  WBC 8.7  HGB 12.2*  HCT 35.4*   Recent Labs    04/10/20 0528  NA 140  K 4.6  CL 105  CO2 26  GLUCOSE 119*  BUN 7*  CREATININE 0.71  CALCIUM 8.5*   No results for input(s): LABPT, INR in the last 72 hours. No results for input(s): LABURIN in the last 72 hours. Results for orders placed or performed during the hospital encounter of 04/05/20  SARS CORONAVIRUS 2 (TAT 6-24 HRS) Nasopharyngeal Nasopharyngeal Swab     Status: None   Collection Time: 04/05/20  9:33 AM   Specimen: Nasopharyngeal Swab  Result Value Ref Range Status   SARS Coronavirus 2 NEGATIVE NEGATIVE Final    Comment: (NOTE) SARS-CoV-2 target nucleic acids are NOT DETECTED.  The SARS-CoV-2 RNA is generally detectable in upper and lower respiratory specimens during the acute phase of infection. Negative results do not  preclude SARS-CoV-2 infection, do not rule out co-infections with other pathogens, and should not be used as the sole basis for treatment or other patient management decisions. Negative results must be combined with clinical observations, patient history, and epidemiological information. The expected result is Negative.  Fact Sheet for Patients: SugarRoll.be  Fact Sheet for Healthcare Providers: https://www.woods-mathews.com/  This test is not yet approved or cleared by the [place] FDA and  has been authorized for detection and/or diagnosis of SARS-CoV-2 by FDA under an Emergency Use Authorization (EUA). This EUA will remain  in effect (meaning this test can be used) for the duration of the COVID-19 declaration under Se ction 564(b)(1) of the Act, 21 U.S.C. section 360bbb-3(b)(1), unless the authorization is terminated or revoked sooner.  Performed at [facility], [place] [address], [place], [place] [address]     Disposition: Home  Discharge instruction: The patient was instructed to be ambulatory but told to refrain from heavy lifting, strenuous activity, or driving. He will continue foley catheter at home and it will be removed in the office in 10-14 days.   Discharge medications:  Allergies as of 04/10/2020      Reactions   Penicillins Other (See Comments)   Does not remember what happens-childhood allergy Has patient had a PCN reaction causing immediate rash, facial/tongue/throat swelling, SOB or lightheadedness with hypotension: Unknown Has patient had a PCN reaction causing severe rash involving mucus membranes or skin necrosis: Unknown Has patient had a PCN reaction that required hospitalization  Unknown Has patient had a PCN reaction occurring within the last 10 years: No If all of the above answers are "NO", then may proceed with Cephalosporin use      Medication List    TAKE these medications   ciprofloxacin 500 MG  tablet Commonly known as: Cipro Take 1 tablet (500 mg total) by mouth once for 1 dose. Take 1 hour prior to catheter removal   docusate sodium 100 MG capsule Commonly known as: Colace Take 1 capsule (100 mg total) by mouth 2 (two) times daily.   HYDROcodone-acetaminophen 5-325 MG tablet Commonly known as: NORCO/VICODIN Take 1 tablet by mouth every 4 (four) hours as needed for moderate pain.   oxybutynin 10 MG 24 hr tablet Commonly known as: DITROPAN-XL Take 1 tablet (10 mg total) by mouth daily.       Followup:   Follow-up Information    [facility].   Why: You will be contacted with your postop appointment date/time. Contact information: [place] [phone]

## 2020-04-15 LAB — SURGICAL PATHOLOGY

## 2020-04-22 ENCOUNTER — Telehealth: Payer: Self-pay | Admitting: Oncology

## 2020-04-22 NOTE — Telephone Encounter (Signed)
I received a new pt referral from Dr. Claudia Desanctis at [facility] for bladder cancer. Mr. Brisco returned my call to schedule a new pt appt w/Dr. Alen Blew on 7/20 at 2pm. Pt aware to arrive 15 minutes early.

## 2020-04-29 ENCOUNTER — Inpatient Hospital Stay: Payer: 59 | Attending: Oncology | Admitting: Oncology

## 2020-04-29 ENCOUNTER — Other Ambulatory Visit: Payer: Self-pay

## 2020-04-29 VITALS — BP 116/71 | HR 65 | Temp 97.8°F | Resp 20 | Ht 73.0 in | Wt 178.3 lb

## 2020-04-29 DIAGNOSIS — F17210 Nicotine dependence, cigarettes, uncomplicated: Secondary | ICD-10-CM | POA: Insufficient documentation

## 2020-04-29 DIAGNOSIS — C679 Malignant neoplasm of bladder, unspecified: Secondary | ICD-10-CM | POA: Insufficient documentation

## 2020-04-29 DIAGNOSIS — N3289 Other specified disorders of bladder: Secondary | ICD-10-CM

## 2020-04-29 NOTE — Progress Notes (Signed)
Reason for the request:    Bladder cancer  HPI: I was asked by Dr. Claudia Desanctis to evaluate Jonathan Ray for the evaluation of bladder cancer.  He is 64 year old man with history of and descended testis was found to have an abnormal bladder lesion on CT scan in May 2021.  He has subsequently underwent robotic assisted laparoscopic partial cystectomy under the care of Dr. Louis Meckel on April 09, 2020.  The final pathology showed poorly differentiated carcinoma measuring 2.4 cm with focally invades into the perivesical connective tissue with margins are negative.  The final pathological staging T3a disease.  Clinically, he has recovered reasonably well without any specific complaints.  He denies any abdominal pain nausea or excessive fatigue.  He denies any hematochezia or melena.   He does not report any headaches, blurry vision, syncope or seizures. Does not report any fevers, chills or sweats.  Does not report any cough, wheezing or hemoptysis.  Does not report any chest pain, palpitation, orthopnea or leg edema.  Does not report any nausea, vomiting or abdominal pain.  Does not report any constipation or diarrhea.  Does not report any skeletal complaints.    Does not report frequency, urgency or hematuria.  Does not report any skin rashes or lesions. Does not report any heat or cold intolerance.  Does not report any lymphadenopathy or petechiae.  Does not report any anxiety or depression.  Remaining review of systems is negative.    Past Medical History:  Diagnosis Date  . Bladder tumor   . GERD (gastroesophageal reflux disease)    occ  . Heart murmur   . Irregular heartbeat   . Testicular cancer [facility])   :  Past Surgical History:  Procedure Laterality Date  . MOUTH SURGERY    . ROBOT ASSISTED LAPAROSCOPIC COMPLETE CYSTECT ILEAL CONDUIT N/A 04/09/2020   Procedure: XI ROBOTIC ASSISTED LAPAROSCOPIC PARTIAL  CYSTECTOMY FLEXIBLE CYSTOSCOPY;  Surgeon: [name], MD;  Location: [facility];  Service:  Urology;  Laterality: N/A;  . SURGERY SCROTAL / TESTICULAR  1962 and 1963  . TESTICLE REMOVAL    :   Current Outpatient Medications:  .  docusate sodium (COLACE) 100 MG capsule, Take 1 capsule (100 mg total) by mouth 2 (two) times daily., Disp: 60 capsule, Rfl: 0 .  HYDROcodone-acetaminophen (NORCO/VICODIN) 5-325 MG tablet, Take 1 tablet by mouth every 4 (four) hours as needed for moderate pain., Disp: 20 tablet, Rfl: 0 .  oxybutynin (DITROPAN-XL) 10 MG 24 hr tablet, Take 1 tablet (10 mg total) by mouth daily., Disp: 30 tablet, Rfl: 0:  Allergies  Allergen Reactions  . Penicillins Other (See Comments)    Does not remember what happens-childhood allergy Has patient had a PCN reaction causing immediate rash, facial/tongue/throat swelling, SOB or lightheadedness with hypotension: Unknown Has patient had a PCN reaction causing severe rash involving mucus membranes or skin necrosis: Unknown Has patient had a PCN reaction that required hospitalization Unknown Has patient had a PCN reaction occurring within the last 10 years: No If all of the above answers are "NO", then may proceed with Cephalosporin use  :  Family History  Problem Relation Age of Onset  . Other Father        eye sight loss  . Healthy Mother   . Cancer Other   :  Social History   Socioeconomic History  . Marital status: Married    Spouse name: Not on file  . Number of children: Not on file  . Years of  education: Not on file  . Highest education level: Not on file  Occupational History  . Not on file  Tobacco Use  . Smoking status: Current Every Day Smoker    Packs/day: 1.00    Years: 40.00    Pack years: 40.00    Types: Cigarettes  . Smokeless tobacco: Never Used  Vaping Use  . Vaping Use: Never used  Substance and Sexual Activity  . Alcohol use: No    Alcohol/week: 0.0 standard drinks    Comment: former   . Drug use: Not Currently  . Sexual activity: Not on file  Other Topics Concern  . Not on file   Social History Narrative  . Not on file   Social Determinants of Health   Financial Resource Strain:   . Difficulty of Paying Living Expenses:   Food Insecurity:   . Worried About Charity fundraiser in the Last Year:   . Arboriculturist in the Last Year:   Transportation Needs:   . Film/video editor (Medical):   Marland Kitchen Lack of Transportation (Non-Medical):   Physical Activity:   . Days of Exercise per Week:   . Minutes of Exercise per Session:   Stress:   . Feeling of Stress :   Social Connections:   . Frequency of Communication with Friends and Family:   . Frequency of Social Gatherings with Friends and Family:   . Attends Religious Services:   . Active Member of Clubs or Organizations:   . Attends Archivist Meetings:   Marland Kitchen Marital Status:   Intimate Partner Violence:   . Fear of Current or Ex-Partner:   . Emotionally Abused:   Marland Kitchen Physically Abused:   . Sexually Abused:   :  Pertinent items are noted in HPI.  Exam: Blood pressure 116/71, pulse 65, temperature 97.8 F (36.6 C), temperature source Temporal, resp. rate 20, height 6\' 1"  (1.854 m), weight 178 lb 4.8 oz (80.9 kg), SpO2 98 %.  General appearance: alert and cooperative appeared without distress. Head: atraumatic without any abnormalities. Eyes: conjunctivae/corneas clear. PERRL.  Sclera anicteric. Throat: lips, mucosa, and tongue normal; without oral thrush or ulcers. Resp: clear to auscultation bilaterally without rhonchi, wheezes or dullness to percussion. Cardio: regular rate and rhythm, S1, S2 normal, no murmur, click, rub or gallop GI: soft, non-tender; bowel sounds normal; no masses,  no organomegaly Skin: Skin color, texture, turgor normal. No rashes or lesions Lymph nodes: Cervical, supraclavicular, and axillary nodes normal. Neurologic: Grossly normal without any motor, sensory or deep tendon reflexes. Musculoskeletal: No joint deformity or effusion.    IMPRESSION: 1. 2.5 cm focal area  of bladder wall thickening suspicious for bladder neoplasm. This lies along the anterior superior aspect of the bladder. Follow-up cystoscopy is recommended. 2. There are no findings to account for bilateral inguinal region pain. 3. Right testicle lies in the inferior right inguinal canal. 4. Mild increased colonic stool burden. No bowel obstruction or inflammation. 5. Iliac artery atherosclerosis.  No significant stenosis.     Assessment and Plan:    64 year old man with:  1.  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  He was found to have 2.4 cm with invasion into the perivesical connective tissue indicating T3a disease.  He is status post partial cystectomy completed on April 09, 2020 without any evidence of metastatic disease.  He had a CT scan of the pelvis in April and CT scan of the chest in October 2020  without any evidence of metastatic disease.  The natural course of this disease and treatment options were reviewed at this time.  Given the variant histology at this time it is unclear what the role of adjuvant systemic chemotherapy in this particular setting.  There is no clear evidence that additional chemotherapy is warranted at this time but surveillance would be recommended moving forward.  Repeating cystoscopy every 3 to 6 months would be needed potentially repeat imaging studies in 6 months as well.  Systemic chemotherapy would be indicated if he had recurrent disease outside of his bladder or lymph node involvement.  All his questions were answered today to his satisfaction.  He is already set up for surveillance cystoscopy under the care of Dr. Claudia Desanctis.  2.  Follow-up: Will be as needed in the future.  45  minutes were dedicated to this visit. The time was spent on reviewing laboratory data, imaging studies, discussing treatment options, and answering questions regarding future plan.      A copy of this consult has been forwarded to the  requesting physician.

## 2020-08-01 ENCOUNTER — Ambulatory Visit
Admission: RE | Admit: 2020-08-01 | Discharge: 2020-08-01 | Disposition: A | Discharge status: Home / Self Care | Payer: 59 | Source: Ambulatory Visit | Attending: Internal Medicine | Admitting: Internal Medicine

## 2020-08-01 DIAGNOSIS — Z720 Tobacco use: Secondary | ICD-10-CM

## 2020-08-18 ENCOUNTER — Encounter: Payer: Self-pay | Admitting: *Deleted

## 2021-02-20 ENCOUNTER — Other Ambulatory Visit: Payer: Self-pay | Admitting: Urology

## 2021-02-20 ENCOUNTER — Encounter (HOSPITAL_BASED_OUTPATIENT_CLINIC_OR_DEPARTMENT_OTHER): Payer: Self-pay | Admitting: Urology

## 2021-02-20 ENCOUNTER — Other Ambulatory Visit (HOSPITAL_COMMUNITY): Payer: 59

## 2021-02-23 ENCOUNTER — Encounter (HOSPITAL_BASED_OUTPATIENT_CLINIC_OR_DEPARTMENT_OTHER): Payer: Self-pay | Admitting: Urology

## 2021-02-23 ENCOUNTER — Other Ambulatory Visit: Payer: Self-pay

## 2021-02-23 ENCOUNTER — Other Ambulatory Visit (HOSPITAL_COMMUNITY)
Admission: RE | Admit: 2021-02-23 | Discharge: 2021-02-23 | Disposition: A | Discharge status: Home / Self Care | Payer: 59 | Source: Ambulatory Visit | Attending: Urology | Admitting: Urology

## 2021-02-23 DIAGNOSIS — Z20822 Contact with and (suspected) exposure to covid-19: Secondary | ICD-10-CM | POA: Insufficient documentation

## 2021-02-23 DIAGNOSIS — Z01812 Encounter for preprocedural laboratory examination: Secondary | ICD-10-CM | POA: Diagnosis present

## 2021-02-23 MED ORDER — GEMCITABINE CHEMO FOR BLADDER INSTILLATION 2000 MG: 2000.0000 mg | Freq: Once | INTRAVENOUS | Status: AC

## 2021-02-23 NOTE — H&P (Signed)
CC/HPI: cc: bladder cancer   10/21/2020: 65 year old man with a history of pT3a poorly differentiated carcinoma of the bladder status post robotic assisted laparoscopic partial cystectomy 04/08/2020. He is here for surveillance cystoscopy. Patient has not noticed any gross hematuria in interim. Urinalysis today is normal. He and his wife are working on cutting back on smoking and eventually quitting.     ALLERGIES: Penicillin    MEDICATIONS: None   GU PSH: Cystoscopy - 10/21/2020, 07/14/2020, 03/06/2020 Inject For cystogram - 04/18/2020 Partial Remove Bladder - 04/09/2020     NON-GU PSH: None   GU PMH: Bladder Cancer Dome, Surveillance cystoscopy without recurrence. Will repeat cysto in 3 months. Will send urine for cytology today. - 10/21/2020, Cysto without evidence of recurrence. Repeat in 3 months. Will send urine cytology. , - 07/14/2020, Discussed pathology report with patient which showed poorly differentiated carcinoma with squamous differentiation that extended perivesical fat pathologic T3a. I discussed that often times with T2 or greater that we recommend cystectomy with possible new adjuvant chemotherapy. However, given this was a partial cystectomy with negative margins and it was unifocal we may consider bladder sparing measures. I would like for him to go see Dr. Alen Blew for an oncology consultation. He will also need surveillance cystoscopy in 3 months. Patient to be presented at multidisciplinary tumor board., - 04/18/2020 Bladder tumor/neoplasm, Based on cystoscopy findings of a bladder mass emanating from to diverticulum patient will need a partial cystectomy. - 03/06/2020 History of testicular cancer - 03/06/2020 Weak Urinary Stream - 03/06/2020      PMH Notes:  1898-10-11 00:00:00 - Note: Normal Routine History And Physical Adult   NON-GU PMH: None   FAMILY HISTORY: None   SOCIAL HISTORY: Marital Status: Married Preferred Language: English; Ethnicity: Not Hispanic Or Latino;  Race: White Current Smoking Status: Patient smokes.   Tobacco Use Assessment Completed: Used Tobacco in last 30 days? Has never drank.  Drinks 3 caffeinated drinks per day.    REVIEW OF SYSTEMS:    GU Review Male:   Patient denies frequent urination, hard to postpone urination, burning/ pain with urination, get up at night to urinate, leakage of urine, stream starts and stops, trouble starting your stream, have to strain to urinate , erection problems, and penile pain.  Gastrointestinal (Upper):   Patient denies nausea, vomiting, and indigestion/ heartburn.  Gastrointestinal (Lower):   Patient denies diarrhea and constipation.  Constitutional:   Patient denies fever, night sweats, weight loss, and fatigue.  Skin:   Patient denies skin rash/ lesion and itching.  Eyes:   Patient denies blurred vision and double vision.  Ears/ Nose/ Throat:   Patient denies sore throat and sinus problems.  Hematologic/Lymphatic:   Patient denies swollen glands and easy bruising.  Cardiovascular:   Patient denies leg swelling and chest pains.  Respiratory:   Patient denies cough and shortness of breath.  Endocrine:   Patient denies excessive thirst.  Musculoskeletal:   Patient denies joint pain and back pain.  Neurological:   Patient denies headaches and dizziness.  Psychologic:   Patient denies depression and anxiety.   VITAL SIGNS: None   MULTI-SYSTEM PHYSICAL EXAMINATION:    Constitutional: Well-nourished. No physical deformities. Normally developed. Good grooming.  Neck: Neck symmetrical, not swollen. Normal tracheal position.  Respiratory: No labored breathing, no use of accessory muscles.   Skin: No paleness, no jaundice, no cyanosis. No lesion, no ulcer, no rash.  Neurologic / Psychiatric: Oriented to time, oriented to place, oriented to person. No depression,  no anxiety, no agitation.  Gastrointestinal: No rigidity, non obese abdomen.   Eyes: Normal conjunctivae. Normal eyelids.  Ears, Nose,  Mouth, and Throat: Left ear no scars, no lesions, no masses. Right ear no scars, no lesions, no masses. Nose no scars, no lesions, no masses. Normal hearing. Normal lips.  Musculoskeletal: Normal gait and station of head and neck.     Complexity of Data:  Records Review:   Previous Patient Records  Urine Test Review:   Urinalysis   PROCEDURES:         Flexible Cystoscopy - 52000  Risks, benefits, and some of the potential complications of the procedure were discussed at length with the patient including infection, bleeding, voiding discomfort, urinary retention, fever, chills, sepsis, and others. All questions were answered. Informed consent was obtained. Sterile technique and intraurethral analgesia were used.  Meatus:  Normal size. Normal location. Normal condition.  Urethra:  No strictures.  External Sphincter:  Normal.  Verumontanum:  Normal.  Prostate:  Obstructing lateral lobes  Bladder Neck:  Non-obstructing.  Ureteral Orifices:  Normal location. Normal size. Normal shape. Effluxed clear urine.  Bladder:  Approximately 1 cm bladder mass seen on posterior superior bladder wall located at different site from prior resection of the dome, previously noted diverticulum examined without concerning findings      The lower urinary tract was carefully examined. The procedure was well-tolerated and without complications. Instructions were given to call the office immediately for bloody urine, difficulty urinating, urinary retention, painful or frequent urination, fever, chills, nausea, vomiting or other illness. The patient stated that he understood these instructions and would comply with them.         Urinalysis Dipstick Dipstick Cont'd  Color: Yellow Bilirubin: Neg mg/dL  Appearance: Clear Ketones: Neg mg/dL  Specific Gravity: <=1.005 Blood: Neg ery/uL  pH: <=5.0 Protein: Neg mg/dL  Glucose: Neg mg/dL Urobilinogen: 0.2 mg/dL    Nitrites: Neg    Leukocyte Esterase: Neg leu/uL     ASSESSMENT:      ICD-10 Details  1 GU:   Bladder Cancer Posterior - C67.4 Chronic, Exacerbation - Bladder cancer recurrence noted on cystoscopy today. It is in a different location then previously noted. I will schedule patient for TURBT with intravesical gemcitabine. Risks and benefits of the above procedure were discussed with the patient in detail including but not limited to bleeding, infection, pain, bladder perforation, damage to surrounding structures, need for Foley catheter, future intervention or treatment. Patient will be scheduled for surgery.

## 2021-02-23 NOTE — Progress Notes (Signed)
Spoke w/ via phone for pre-op interview--- PT Lab needs dos---- no              Lab results------ no COVID test ------ 02-23-2021 @ 9678 Arrive at ------- 0730 on 02-24-2021 NPO after MN NO Solid Food.  Clear liquids from MN until--- 0630 Med rec completed Medications to take morning of surgery ----- NONE Diabetic medication ----- n/a Patient instructed to bring photo id and insurance card day of surgery Patient aware to have Driver (ride ) / caregiver    for 24 hours after surgery-- wife, Mickel Baas Patient Special Instructions ----- n/a Pre-Op special Istructions ----- n/a Patient verbalized understanding of instructions that were given at this phone interview. Patient denies shortness of breath, chest pain, fever, cough at this phone interview.

## 2021-02-24 ENCOUNTER — Encounter (HOSPITAL_BASED_OUTPATIENT_CLINIC_OR_DEPARTMENT_OTHER): Payer: Self-pay | Admitting: Urology

## 2021-02-24 ENCOUNTER — Ambulatory Visit (HOSPITAL_BASED_OUTPATIENT_CLINIC_OR_DEPARTMENT_OTHER): Payer: 59 | Admitting: Anesthesiology

## 2021-02-24 ENCOUNTER — Ambulatory Visit (HOSPITAL_BASED_OUTPATIENT_CLINIC_OR_DEPARTMENT_OTHER)
Admission: RE | Admit: 2021-02-24 | Discharge: 2021-02-24 | Disposition: A | Discharge status: Home / Self Care | Payer: 59 | Attending: Urology | Admitting: Urology

## 2021-02-24 ENCOUNTER — Encounter (HOSPITAL_BASED_OUTPATIENT_CLINIC_OR_DEPARTMENT_OTHER)
Admission: RE | Disposition: A | Discharge status: Home / Self Care | Payer: Self-pay | Source: Home / Self Care | Attending: Urology

## 2021-02-24 DIAGNOSIS — D090 Carcinoma in situ of bladder: Secondary | ICD-10-CM | POA: Diagnosis present

## 2021-02-24 DIAGNOSIS — Z880 Allergy status to penicillin: Secondary | ICD-10-CM | POA: Insufficient documentation

## 2021-02-24 DIAGNOSIS — F17200 Nicotine dependence, unspecified, uncomplicated: Secondary | ICD-10-CM | POA: Diagnosis not present

## 2021-02-24 HISTORY — DX: Malignant neoplasm of bladder, unspecified: C67.9

## 2021-02-24 HISTORY — PX: TRANSURETHRAL RESECTION OF BLADDER TUMOR WITH MITOMYCIN-C: SHX6459

## 2021-02-24 HISTORY — DX: Preglaucoma, unspecified, bilateral: H40.003

## 2021-02-24 HISTORY — DX: Personal history of other specified conditions: Z87.898

## 2021-02-24 HISTORY — DX: Alcohol dependence, in remission: F10.21

## 2021-02-24 HISTORY — DX: Personal history of diseases of the blood and blood-forming organs and certain disorders involving the immune mechanism: Z86.2

## 2021-02-24 HISTORY — DX: Chronic obstructive pulmonary disease, unspecified: J44.9

## 2021-02-24 HISTORY — DX: Presence of spectacles and contact lenses: Z97.3

## 2021-02-24 HISTORY — DX: Simple chronic bronchitis: J41.0

## 2021-02-24 LAB — SARS CORONAVIRUS 2 (TAT 6-24 HRS): SARS Coronavirus 2: NEGATIVE

## 2021-02-24 SURGERY — TRANSURETHRAL RESECTION OF BLADDER TUMOR WITH MITOMYCIN-C
Anesthesia: General | Site: Bladder

## 2021-02-24 MED ORDER — LIDOCAINE 2% (20 MG/ML) 5 ML SYRINGE
INTRAMUSCULAR | Status: AC
  Filled 2021-02-24: qty 15

## 2021-02-24 MED ORDER — TRAMADOL HCL 50 MG PO TABS
50.0000 mg | ORAL_TABLET | Freq: Once | ORAL | Status: AC
  Administered 2021-02-24: 50 mg via ORAL

## 2021-02-24 MED ORDER — LACTATED RINGERS IV SOLN: INTRAVENOUS | Status: DC

## 2021-02-24 MED ORDER — GEMCITABINE CHEMO FOR BLADDER INSTILLATION 2000 MG
2000.0000 mg | Freq: Once | INTRAVENOUS | Status: AC
Start: 2021-02-24 — End: 2021-02-24
  Administered 2021-02-24: 2000 mg via INTRAVESICAL
  Filled 2021-02-24: qty 2000

## 2021-02-24 MED ORDER — CIPROFLOXACIN IN D5W 400 MG/200ML IV SOLN
400.0000 mg | INTRAVENOUS | Status: AC
  Administered 2021-02-24: 400 mg via INTRAVENOUS

## 2021-02-24 MED ORDER — FENTANYL CITRATE (PF) 100 MCG/2ML IJ SOLN
INTRAMUSCULAR | Status: DC | PRN
  Administered 2021-02-24: 50 ug via INTRAVENOUS
  Administered 2021-02-24: 25 ug via INTRAVENOUS

## 2021-02-24 MED ORDER — LIDOCAINE HCL (CARDIAC) PF 100 MG/5ML IV SOSY
PREFILLED_SYRINGE | INTRAVENOUS | Status: DC | PRN
  Administered 2021-02-24: 80 mg via INTRAVENOUS

## 2021-02-24 MED ORDER — CIPROFLOXACIN IN D5W 400 MG/200ML IV SOLN
INTRAVENOUS | Status: AC
  Filled 2021-02-24: qty 200

## 2021-02-24 MED ORDER — SODIUM CHLORIDE 0.9 % IR SOLN
Status: DC | PRN
  Administered 2021-02-24: 3000 mL

## 2021-02-24 MED ORDER — FENTANYL CITRATE (PF) 100 MCG/2ML IJ SOLN: 25.0000 ug | INTRAMUSCULAR | Status: DC | PRN

## 2021-02-24 MED ORDER — ROCURONIUM BROMIDE 10 MG/ML (PF) SYRINGE
PREFILLED_SYRINGE | INTRAVENOUS | Status: AC
  Filled 2021-02-24: qty 10

## 2021-02-24 MED ORDER — TRAMADOL HCL 50 MG PO TABS
ORAL_TABLET | ORAL | Status: AC
  Filled 2021-02-24: qty 1

## 2021-02-24 MED ORDER — MIDAZOLAM HCL 2 MG/2ML IJ SOLN
INTRAMUSCULAR | Status: DC | PRN
  Administered 2021-02-24: 1 mg via INTRAVENOUS

## 2021-02-24 MED ORDER — MIDAZOLAM HCL 2 MG/2ML IJ SOLN
INTRAMUSCULAR | Status: AC
  Filled 2021-02-24: qty 2

## 2021-02-24 MED ORDER — GLYCOPYRROLATE 0.2 MG/ML IJ SOLN
INTRAMUSCULAR | Status: DC | PRN
  Administered 2021-02-24: .1 mg via INTRAVENOUS
  Administered 2021-02-24: .2 mg via INTRAVENOUS

## 2021-02-24 MED ORDER — PROPOFOL 10 MG/ML IV BOLUS
INTRAVENOUS | Status: DC | PRN
  Administered 2021-02-24: 150 mg via INTRAVENOUS
  Administered 2021-02-24: 50 mg via INTRAVENOUS

## 2021-02-24 MED ORDER — TRAMADOL HCL 50 MG PO TABS: 50.0000 mg | ORAL_TABLET | Freq: Four times a day (QID) | ORAL | 0 refills | Status: DC | PRN

## 2021-02-24 MED ORDER — FENTANYL CITRATE (PF) 100 MCG/2ML IJ SOLN
INTRAMUSCULAR | Status: AC
  Filled 2021-02-24: qty 2

## 2021-02-24 MED ORDER — PROPOFOL 10 MG/ML IV BOLUS
INTRAVENOUS | Status: AC
  Filled 2021-02-24: qty 20

## 2021-02-24 MED ORDER — EPHEDRINE SULFATE 50 MG/ML IJ SOLN
INTRAMUSCULAR | Status: DC | PRN
  Administered 2021-02-24: 10 mg via INTRAVENOUS

## 2021-02-24 MED ORDER — MEPERIDINE HCL 25 MG/ML IJ SOLN: 6.2500 mg | INTRAMUSCULAR | Status: DC | PRN

## 2021-02-24 MED ORDER — GLYCOPYRROLATE PF 0.2 MG/ML IJ SOSY
PREFILLED_SYRINGE | INTRAMUSCULAR | Status: AC
  Filled 2021-02-24: qty 5

## 2021-02-24 MED ORDER — PROMETHAZINE HCL 25 MG/ML IJ SOLN: 6.2500 mg | INTRAMUSCULAR | Status: DC | PRN

## 2021-02-24 SURGICAL SUPPLY — 23 items
BAG DRAIN URO-CYSTO SKYTR STRL (DRAIN) ×2 IMPLANT
BAG DRN RND TRDRP ANRFLXCHMBR (UROLOGICAL SUPPLIES) ×1
BAG DRN UROCATH (DRAIN) ×1
BAG URINE DRAIN 2000ML AR STRL (UROLOGICAL SUPPLIES) ×1 IMPLANT
CATH FOLEY 2WAY SLVR  5CC 18FR (CATHETERS)
CATH FOLEY 2WAY SLVR  5CC 20FR (CATHETERS) ×2
CATH FOLEY 2WAY SLVR 5CC 18FR (CATHETERS) IMPLANT
CATH FOLEY 2WAY SLVR 5CC 20FR (CATHETERS) IMPLANT
CLOTH BEACON ORANGE TIMEOUT ST (SAFETY) ×2 IMPLANT
ELECT REM PT RETURN 9FT ADLT (ELECTROSURGICAL)
ELECTRODE REM PT RTRN 9FT ADLT (ELECTROSURGICAL) ×1 IMPLANT
GLOVE SURG ENC MOIS LTX SZ6.5 (GLOVE) ×2 IMPLANT
GOWN STRL REUS W/TWL LRG LVL3 (GOWN DISPOSABLE) ×2 IMPLANT
HOLDER FOLEY CATH W/STRAP (MISCELLANEOUS) ×2 IMPLANT
IV NS IRRIG 3000ML ARTHROMATIC (IV SOLUTION) ×5 IMPLANT
KIT TURNOVER CYSTO (KITS) ×2 IMPLANT
LOOP CUT BIPOLAR 24F LRG (ELECTROSURGICAL) ×1 IMPLANT
MANIFOLD NEPTUNE II (INSTRUMENTS) ×2 IMPLANT
PACK CYSTO (CUSTOM PROCEDURE TRAY) ×2 IMPLANT
SYR TOOMEY IRRIG 70ML (MISCELLANEOUS) ×2
SYRINGE TOOMEY IRRIG 70ML (MISCELLANEOUS) ×1 IMPLANT
TUBE CONNECTING 12X1/4 (SUCTIONS) ×2 IMPLANT
TUBING UROLOGY SET (TUBING) ×1 IMPLANT

## 2021-02-24 NOTE — Interval H&P Note (Signed)
History and Physical Interval Note:  02/24/2021 7:25 AM  Jonathan Ray  has presented today for surgery, with the diagnosis of BLADDER CANCER.  The various methods of treatment have been discussed with the patient and family. After consideration of risks, benefits and other options for treatment, the patient has consented to  Procedure(s) with comments: TRANSURETHRAL RESECTION OF BLADDER TUMOR WITH GEMCITABINE (N/A) - 1HR as a surgical intervention.  The patient's history has been reviewed, patient examined, no change in status, stable for surgery.  I have reviewed the patient's chart and labs.  Questions were answered to the patient's satisfaction.     Jaycelyn Orrison D Tressa Maldonado

## 2021-02-24 NOTE — Anesthesia Postprocedure Evaluation (Signed)
Anesthesia Post Note  Patient: Jonathan Ray  Procedure(s) Performed: TRANSURETHRAL RESECTION OF BLADDER TUMOR WITH POST OPERATIVE INSTILLATION OF GEMCITABINE (N/A Bladder)     Patient location during evaluation: PACU Anesthesia Type: General Level of consciousness: sedated and patient cooperative Pain management: pain level controlled Vital Signs Assessment: post-procedure vital signs reviewed and stable Respiratory status: spontaneous breathing Cardiovascular status: stable Anesthetic complications: no   No complications documented.  Last Vitals:  Vitals:   02/24/21 1130 02/24/21 1223  BP: 108/60 (!) 144/84  Pulse: (!) 51 60  Resp: 18 18  Temp:  (!) 36.3 C  SpO2: 100% 100%    Last Pain:  Vitals:   02/24/21 1223  TempSrc:   PainSc: [place]

## 2021-02-24 NOTE — Interval H&P Note (Signed)
History and Physical Interval Note:  02/24/2021 8:19 AM  Jonathan Ray  has presented today for surgery, with the diagnosis of BLADDER CANCER.  The various methods of treatment have been discussed with the patient and family. After consideration of risks, benefits and other options for treatment, the patient has consented to  Procedure(s) with comments: TRANSURETHRAL RESECTION OF BLADDER TUMOR WITH GEMCITABINE (N/A) - 1HR as a surgical intervention.  The patient's history has been reviewed, patient examined, no change in status, stable for surgery.  I have reviewed the patient's chart and labs.  Questions were answered to the patient's satisfaction.     Dinesha Twiggs D Jafari Mckillop

## 2021-02-24 NOTE — Transfer of Care (Signed)
Immediate Anesthesia Transfer of Care Note  Patient: Jonathan Ray  Procedure(s) Performed: TRANSURETHRAL RESECTION OF BLADDER TUMOR WITH POST OPERATIVE INSTILLATION OF GEMCITABINE (N/A Bladder)  Patient Location: PACU  Anesthesia Type:General  Level of Consciousness: awake, alert , oriented and patient cooperative  Airway & Oxygen Therapy: Patient Spontanous Breathing  Post-op Assessment: Report given to RN and Post -op Vital signs reviewed and stable  Post vital signs: Reviewed and stable  Last Vitals:  Vitals Value Taken Time  BP 144/66 02/24/21 0935  Temp    Pulse 78 02/24/21 0936  Resp 13 02/24/21 0936  SpO2 98 % 02/24/21 0936  Vitals shown include unvalidated device data.  Last Pain:  Vitals:   02/24/21 0757  TempSrc: Oral      Patients Stated Pain Goal: 3 (17/49/44 9675)  Complications: No complications documented.

## 2021-02-24 NOTE — Anesthesia Procedure Notes (Signed)
Procedure Name: LMA Insertion Date/Time: 02/24/2021 9:01 AM Performed by: Georgeanne Nim, CRNA Pre-anesthesia Checklist: Emergency Drugs available, Patient identified, Suction available, Patient being monitored and Timeout performed Patient Re-evaluated:Patient Re-evaluated prior to induction Oxygen Delivery Method: Circle system utilized Preoxygenation: Pre-oxygenation with 100% oxygen Induction Type: IV induction Ventilation: Mask ventilation without difficulty LMA: LMA inserted LMA Size: 5.0 Number of attempts: 1 Placement Confirmation: positive ETCO2,  CO2 detector and breath sounds checked- equal and bilateral Tube secured with: Tape Dental Injury: Teeth and Oropharynx as per pre-operative assessment

## 2021-02-24 NOTE — Discharge Instructions (Signed)
Transurethral Resection of Bladder Tumor (TURBT)   Definition:  Transurethral Resection of the Bladder Tumor is a surgical procedure used to diagnose and remove tumors within the bladder. TURBT is the most common treatment for early stage bladder cancer.  General instructions:     Your recent bladder surgery requires very little post hospital care but some definite precautions.  Despite the fact that no skin incisions were used, the area around the bladder incisions are raw and covered with scabs to promote healing and prevent bleeding. Certain precautions are needed to insure that the scabs are not disturbed over the next 2-4 weeks while the healing proceeds.  Because the raw surface inside your bladder and the irritating effects of urine you may expect frequency of urination and/or urgency (a stronger desire to urinate) and perhaps even getting up at night more often. This will usually resolve or improve slowly over the healing period. You may see some blood in your urine over the first 6 weeks. Do not be alarmed, even if the urine was clear for a while. Get off your feet and drink lots of fluids until clearing occurs. If you start to pass clots or don't improve call us.  Catheter: (If you are discharged with a catheter.)  1. Keep your catheter secured to your leg at all times with tape or the supplied strap. 2. You may experience leakage of urine around your catheter- as long as the  catheter continues to drain, this is normal.  If your catheter stops draining  go to the ER. 3. You may also have blood in your urine, even after it has been clear for  several days; you may even pass some small blood clots or other material.  This  is normal as well.  If this happens, sit down and drink plenty of water to help  make urine to flush out your bladder.  If the blood in your urine becomes worse  after doing this, contact our office or return to the ER. 4. You may use the leg bag (small bag)  during the day, but use the large bag at  night.  Diet:  You may return to your normal diet immediately. Because of the raw surface of your bladder, alcohol, spicy foods, foods high in acid and drinks with caffeine may cause irritation or frequency and should be used in moderation. To keep your urine flowing freely and avoid constipation, drink plenty of fluids during the day (8-10 glasses). Tip: Avoid cranberry juice because it is very acidic.  Activity:  Your physical activity doesn't need to be restricted. However, if you are very active, you may see some blood in the urine. We suggest that you reduce your activity under the circumstances until the bleeding has stopped.  Bowels:  It is important to keep your bowels regular during the postoperative period. Straining with bowel movements can cause bleeding. A bowel movement every other day is reasonable. Use a mild laxative if needed, such as milk of magnesia 2-3 tablespoons, or 2 Dulcolax tablets. Call if you continue to have problems. If you had been taking narcotics for pain, before, during or after your surgery, you may be constipated. Take a laxative if necessary.    Medication:  You should resume your pre-surgery medications unless told not to. In addition you may be given an antibiotic to prevent or treat infection. Antibiotics are not always necessary. All medication should be taken as prescribed until the bottles are finished unless you are having   an unusual reaction to one of the drugs.   Post Anesthesia Home Care Instructions  Activity: Get plenty of rest for the remainder of the day. A responsible individual must stay with you for 24 hours following the procedure.  For the next 24 hours, DO NOT: -Drive a car -Operate machinery -Drink alcoholic beverages -Take any medication unless instructed by your physician -Make any legal decisions or sign important papers.  Meals: Start with liquid foods such as gelatin or soup.  Progress to regular foods as tolerated. Avoid greasy, spicy, heavy foods. If nausea and/or vomiting occur, drink only clear liquids until the nausea and/or vomiting subsides. Call your physician if vomiting continues.  Special Instructions/Symptoms: Your throat may feel dry or sore from the anesthesia or the breathing tube placed in your throat during surgery. If this causes discomfort, gargle with warm salt water. The discomfort should disappear within 24 hours.  If you had a scopolamine patch placed behind your ear for the management of post- operative nausea and/or vomiting:  1. The medication in the patch is effective for 72 hours, after which it should be removed.  Wrap patch in a tissue and discard in the trash. Wash hands thoroughly with soap and water. 2. You may remove the patch earlier than 72 hours if you experience unpleasant side effects which may include dry mouth, dizziness or visual disturbances. 3. Avoid touching the patch. Wash your hands with soap and water after contact with the patch.        

## 2021-02-24 NOTE — Anesthesia Preprocedure Evaluation (Signed)
Anesthesia Evaluation  Patient identified by MRN, date of birth, ID band Patient awake    Reviewed: Allergy & Precautions, NPO status , Patient's Chart, lab work & pertinent test results  Airway Mallampati: II  TM Distance: >3 FB Neck ROM: Full    Dental no notable dental hx.    Pulmonary COPD, Current Smoker and Patient abstained from smoking.,    Pulmonary exam normal breath sounds clear to auscultation       Cardiovascular Normal cardiovascular exam+ Valvular Problems/Murmurs MR  Rhythm:Regular Rate:Normal  Echo 2017 - Left ventricle: The cavity size was normal. There was mild focal basal hypertrophy of the septum. Systolic function was normal. The estimated ejection fraction was in the range of 55% to 60%. Wall motion was normal; there were no regional wall motion abnormalities. Left ventricular diastolic function parameterswere normal.  - Aortic valve: Trileaflet; mildly thickened, mildly calcified  leaflets.  - Mitral valve: There was mild regurgitation.  - Right ventricle: The cavity size was mildly dilated. Wall  thickness was normal.    Neuro/Psych PSYCHIATRIC DISORDERS negative neurological ROS     GI/Hepatic Neg liver ROS, GERD  ,  Endo/Other  negative endocrine ROS  Renal/GU negative Renal ROS  negative genitourinary   Musculoskeletal negative musculoskeletal ROS (+)   Abdominal   Peds negative pediatric ROS (+)  Hematology negative hematology ROS (+)   Anesthesia Other Findings   Reproductive/Obstetrics negative OB ROS                            Anesthesia Physical  Anesthesia Plan  ASA: III  Anesthesia Plan: General   Post-op Pain Management:    Induction: Intravenous  PONV Risk Score and Plan: 3 and Ondansetron, Dexamethasone, Treatment may vary due to age or medical condition and Midazolam  Airway Management Planned: Oral ETT and LMA  Additional Equipment:  None  Intra-op Plan:   Post-operative Plan: Extubation in OR  Informed Consent: I have reviewed the patients History and Physical, chart, labs and discussed the procedure including the risks, benefits and alternatives for the proposed anesthesia with the patient or authorized representative who has indicated his/her understanding and acceptance.     Dental advisory given  Plan Discussed with: CRNA and Surgeon  Anesthesia Plan Comments:         Anesthesia Quick Evaluation

## 2021-02-24 NOTE — Op Note (Signed)
PATIENT:  Jonathan Ray  PRE-OPERATIVE DIAGNOSIS: Bladder tumor  POST-OPERATIVE DIAGNOSIS: Same  PROCEDURE:  Procedure(s): 1. TRANSURETHRAL RESECTION OF BLADDER TUMOR (TURBT) (1cm.) 2. Instillation of intravesical chemotherapy (Gemcitabine)  SURGEON:  Jacalyn Lefevre, MD  ANESTHESIA:   General  EBL:  Minimal  DRAINS: Urethral catheter (20 Fr. Foley)   SPECIMEN:   1. Bladder tumor 2. Bladder dome  DISPOSITION OF SPECIMEN:  PATHOLOGY  Findings: 1.  Normal anterior urethra 2.  Lateral lobe prostatic hypertrophy 3.  Bilateral orthotopic ureteral orifices 4.  1 cm circular bladder tumor posterior wall 5.  Small diverticulum inferior posterior wall with normal mucosa 6.  Slightly irregular mucosa at dome  Indication: 65 year old man with history of pT3a poorly differentiated bladder cancer with recurrence noted on surveillance cystoscopy.  Description of operation: The patient was taken to the operating room and administered general anesthesia. They were then placed on the table and moved to the dorsal lithotomy position after which the genitalia was sterilely prepped and draped. An official timeout was then performed.  Urethral sounds were used to dilate the urethra meatus from 18 [place] to 28 [place] in order to accommodate the resectoscope.  The 36 French resectoscope with the 30 lens and visual obturator were then passed into the bladder under direct visualization. Urethra appeared normal. The visual obturator was then removed and the Gyrus resectoscope element with 30  lens was then inserted and the bladder was fully and systematically inspected. Ureteral orifices were noted to be in the normal anatomic positions.   A solitary bladder tumor was seen at the posterior bladder wall.  The resectoscope was then used to remove the tumor with bipolar electrocautery.  Hemostasis was achieved.  Reinspection of the bladder revealed some slightly irregular mucosa at the dome which was  the prior resection site.  Difficult to say if this is scar tissue so a biopsy was obtained with the resectoscope.  Hemostasis was then achieved with electrocautery.  Reinspection of the bladder revealed all obvious tumor had been fully resected and there was no evidence of perforation. The Microvasive evacuator was then used to irrigate the bladder and remove all of the portions of bladder tumor which were sent to pathology. I then removed the resectoscope.  A 20 French Foley catheter was then inserted in the bladder and irrigated. The irrigant returned slightly pink with no clots. The patient was awakened and taken to the recovery room.  While in the recovery room 2 g of gemcitabine in 52.6 cc of sterile water was instilled in the bladder through the catheter and the catheter was plugged. This will remain indwelling for approximately one hour. It will then be drained from the bladder and the catheter will be removed and the patient discharged home.  PLAN OF CARE: Discharge to home after PACU  PATIENT DISPOSITION:  PACU - hemodynamically stable.

## 2021-02-25 ENCOUNTER — Encounter (HOSPITAL_BASED_OUTPATIENT_CLINIC_OR_DEPARTMENT_OTHER): Payer: Self-pay | Admitting: Urology

## 2021-02-26 LAB — SURGICAL PATHOLOGY

## 2021-03-11 ENCOUNTER — Encounter: Payer: Self-pay | Admitting: Oncology

## 2021-09-23 ENCOUNTER — Other Ambulatory Visit: Payer: Self-pay

## 2021-09-23 ENCOUNTER — Encounter (HOSPITAL_COMMUNITY): Payer: Self-pay | Admitting: Emergency Medicine

## 2021-09-23 ENCOUNTER — Emergency Department (HOSPITAL_COMMUNITY)
Admission: EM | Admit: 2021-09-23 | Discharge: 2021-09-24 | Disposition: A | Discharge status: Home / Self Care | Payer: 59 | Attending: Emergency Medicine | Admitting: Emergency Medicine

## 2021-09-23 DIAGNOSIS — Z8547 Personal history of malignant neoplasm of testis: Secondary | ICD-10-CM | POA: Insufficient documentation

## 2021-09-23 DIAGNOSIS — Z7982 Long term (current) use of aspirin: Secondary | ICD-10-CM | POA: Insufficient documentation

## 2021-09-23 DIAGNOSIS — Z8551 Personal history of malignant neoplasm of bladder: Secondary | ICD-10-CM | POA: Diagnosis not present

## 2021-09-23 DIAGNOSIS — N3000 Acute cystitis without hematuria: Secondary | ICD-10-CM

## 2021-09-23 DIAGNOSIS — R339 Retention of urine, unspecified: Secondary | ICD-10-CM | POA: Insufficient documentation

## 2021-09-23 DIAGNOSIS — F17210 Nicotine dependence, cigarettes, uncomplicated: Secondary | ICD-10-CM | POA: Insufficient documentation

## 2021-09-23 DIAGNOSIS — J449 Chronic obstructive pulmonary disease, unspecified: Secondary | ICD-10-CM | POA: Diagnosis not present

## 2021-09-23 DIAGNOSIS — R338 Other retention of urine: Secondary | ICD-10-CM

## 2021-09-23 LAB — URINALYSIS, ROUTINE W REFLEX MICROSCOPIC
Bilirubin Urine: NEGATIVE
Glucose, UA: NEGATIVE mg/dL
Ketones, ur: NEGATIVE mg/dL
Nitrite: NEGATIVE
Protein, ur: 30 mg/dL — AB
Specific Gravity, Urine: >1.03 — ABNORMAL HIGH (ref 1.005–1.030)
pH: 6 (ref 5.0–8.0)

## 2021-09-23 LAB — URINALYSIS, MICROSCOPIC (REFLEX)
RBC / HPF: >50 RBC/hpf (ref 0–5)
Squamous Epithelial / HPF: NONE SEEN (ref 0–5)
WBC, UA: >50 WBC/hpf (ref 0–5)

## 2021-09-23 MED ORDER — MORPHINE SULFATE (PF) 4 MG/ML IV SOLN: 4.0000 mg | Freq: Once | INTRAVENOUS | Status: DC

## 2021-09-23 NOTE — ED Triage Notes (Signed)
Patient BIBA from home c/o difficulty urinating, hematuria, and pressure in the bladder. Pt reports having a urologic procedure (unsure of procedure type) and states symptoms have worsened since then. Hx testicular cancer.    BP 158/102
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
## 2021-09-24 LAB — URINE CULTURE

## 2021-09-24 LAB — CBC
HCT: 38.5 % — ABNORMAL LOW (ref 39.0–52.0)
Hemoglobin: 13.4 g/dL (ref 13.0–17.0)
MCH: 31.3 pg (ref 26.0–34.0)
MCHC: 34.8 g/dL (ref 30.0–36.0)
MCV: 90 fL (ref 80.0–100.0)
Platelets: 217 10*3/uL (ref 150–400)
RBC: 4.28 MIL/uL (ref 4.22–5.81)
RDW: 12.6 % (ref 11.5–15.5)
WBC: 8.5 10*3/uL (ref 4.0–10.5)
nRBC: 0 % (ref 0.0–0.2)

## 2021-09-24 LAB — BASIC METABOLIC PANEL
Anion gap: 10 (ref 5–15)
BUN: 21 mg/dL (ref 8–23)
CO2: 25 mmol/L (ref 22–32)
Calcium: 9.1 mg/dL (ref 8.9–10.3)
Chloride: 100 mmol/L (ref 98–111)
Creatinine, Ser: 1.43 mg/dL — ABNORMAL HIGH (ref 0.61–1.24)
GFR, Estimated: 55 mL/min — ABNORMAL LOW (ref >60)
Glucose, Bld: 146 mg/dL — ABNORMAL HIGH (ref 70–99)
Potassium: 3.7 mmol/L (ref 3.5–5.1)
Sodium: 135 mmol/L (ref 135–145)

## 2021-09-24 LAB — LIPASE, BLOOD: Lipase: 35 U/L (ref 11–51)

## 2021-09-24 MED ORDER — SODIUM CHLORIDE 0.9 % IV SOLN
1.0000 g | Freq: Once | INTRAVENOUS | Status: AC
  Administered 2021-09-24: 1 g via INTRAVENOUS
  Filled 2021-09-24: qty 10

## 2021-09-24 MED ORDER — CEPHALEXIN 500 MG PO CAPS: 500.0000 mg | ORAL_CAPSULE | Freq: Three times a day (TID) | ORAL | 0 refills | Status: DC

## 2021-09-24 NOTE — Discharge Instructions (Signed)
Begin taking Keflex as prescribed.  Follow-up with your urologist on Monday as scheduled, and return to the ER if symptoms significantly worsen or change.

## 2021-10-13 DIAGNOSIS — C679 Malignant neoplasm of bladder, unspecified: Secondary | ICD-10-CM | POA: Diagnosis not present

## 2021-10-13 DIAGNOSIS — R310 Gross hematuria: Secondary | ICD-10-CM | POA: Diagnosis not present

## 2021-10-13 DIAGNOSIS — Z8547 Personal history of malignant neoplasm of testis: Secondary | ICD-10-CM | POA: Diagnosis not present

## 2021-10-13 DIAGNOSIS — Z9079 Acquired absence of other genital organ(s): Secondary | ICD-10-CM | POA: Diagnosis not present

## 2021-10-13 DIAGNOSIS — R102 Pelvic and perineal pain: Secondary | ICD-10-CM | POA: Diagnosis not present

## 2021-10-13 DIAGNOSIS — Z906 Acquired absence of other parts of urinary tract: Secondary | ICD-10-CM | POA: Diagnosis not present

## 2021-10-16 DIAGNOSIS — H21233 Degeneration of iris (pigmentary), bilateral: Secondary | ICD-10-CM | POA: Diagnosis not present

## 2021-10-16 DIAGNOSIS — H2513 Age-related nuclear cataract, bilateral: Secondary | ICD-10-CM | POA: Diagnosis not present

## 2021-11-24 DIAGNOSIS — C671 Malignant neoplasm of dome of bladder: Secondary | ICD-10-CM | POA: Diagnosis not present

## 2021-11-25 ENCOUNTER — Other Ambulatory Visit: Payer: Self-pay | Admitting: Urology

## 2021-11-25 DIAGNOSIS — C671 Malignant neoplasm of dome of bladder: Secondary | ICD-10-CM

## 2021-12-10 ENCOUNTER — Other Ambulatory Visit: Payer: Self-pay

## 2021-12-12 ENCOUNTER — Other Ambulatory Visit: Payer: Self-pay

## 2021-12-12 ENCOUNTER — Ambulatory Visit
Admission: RE | Admit: 2021-12-12 | Discharge: 2021-12-12 | Disposition: A | Discharge status: Home / Self Care | Payer: 59 | Source: Ambulatory Visit | Attending: Urology | Admitting: Urology

## 2021-12-12 DIAGNOSIS — R309 Painful micturition, unspecified: Secondary | ICD-10-CM | POA: Diagnosis not present

## 2021-12-12 DIAGNOSIS — N323 Diverticulum of bladder: Secondary | ICD-10-CM | POA: Diagnosis not present

## 2021-12-12 DIAGNOSIS — N3289 Other specified disorders of bladder: Secondary | ICD-10-CM | POA: Diagnosis not present

## 2021-12-12 DIAGNOSIS — Z8551 Personal history of malignant neoplasm of bladder: Secondary | ICD-10-CM | POA: Diagnosis not present

## 2021-12-12 DIAGNOSIS — C671 Malignant neoplasm of dome of bladder: Secondary | ICD-10-CM

## 2021-12-12 MED ORDER — GADOBENATE DIMEGLUMINE 529 MG/ML IV SOLN
15.0000 mL | Freq: Once | INTRAVENOUS | Status: AC | PRN
  Administered 2021-12-12: 15 mL via INTRAVENOUS

## 2021-12-22 ENCOUNTER — Telehealth: Payer: Self-pay | Admitting: Oncology

## 2021-12-22 NOTE — Telephone Encounter (Signed)
Scheduled appt per 3/14 referral. Pt is aware of appt date and time. Pt is aware to arrive 15 mins prior to appt time and to bring and updated insurance card. Pt is aware of appt location.   ?

## 2021-12-24 ENCOUNTER — Other Ambulatory Visit (HOSPITAL_COMMUNITY): Payer: Self-pay | Admitting: Urology

## 2021-12-30 ENCOUNTER — Other Ambulatory Visit (HOSPITAL_COMMUNITY): Payer: Self-pay | Admitting: Urology

## 2021-12-30 ENCOUNTER — Other Ambulatory Visit: Payer: Self-pay | Admitting: Urology

## 2021-12-30 DIAGNOSIS — C671 Malignant neoplasm of dome of bladder: Secondary | ICD-10-CM

## 2022-01-01 ENCOUNTER — Inpatient Hospital Stay: Payer: 59 | Attending: Oncology | Admitting: Oncology

## 2022-01-01 ENCOUNTER — Other Ambulatory Visit: Payer: Self-pay

## 2022-01-01 VITALS — BP 145/73 | HR 65 | Temp 97.9°F | Resp 18 | Ht 73.0 in | Wt 196.1 lb

## 2022-01-01 DIAGNOSIS — R599 Enlarged lymph nodes, unspecified: Secondary | ICD-10-CM | POA: Diagnosis not present

## 2022-01-01 DIAGNOSIS — C678 Malignant neoplasm of overlapping sites of bladder: Secondary | ICD-10-CM

## 2022-01-01 DIAGNOSIS — F17210 Nicotine dependence, cigarettes, uncomplicated: Secondary | ICD-10-CM | POA: Diagnosis not present

## 2022-01-01 DIAGNOSIS — C679 Malignant neoplasm of bladder, unspecified: Secondary | ICD-10-CM | POA: Diagnosis not present

## 2022-01-01 DIAGNOSIS — R69 Illness, unspecified: Secondary | ICD-10-CM | POA: Diagnosis not present

## 2022-01-01 MED ORDER — PROCHLORPERAZINE MALEATE 10 MG PO TABS: 10.0000 mg | ORAL_TABLET | Freq: Four times a day (QID) | ORAL | 0 refills | Status: DC | PRN

## 2022-01-01 MED ORDER — LIDOCAINE-PRILOCAINE 2.5-2.5 % EX CREA: 1.0000 "application " | TOPICAL_CREAM | CUTANEOUS | 0 refills | Status: AC | PRN

## 2022-01-01 NOTE — Progress Notes (Signed)
START ON PATHWAY REGIMEN - Bladder     A cycle is every 21 days:     Gemcitabine      Cisplatin   **Always confirm dose/schedule in your pharmacy ordering system**  Patient Characteristics: Pre-Cystectomy or Nonsurgical Candidate (Clinical Staging), cT2-4a, cN0-1, M0, Cystectomy Eligible, Cisplatin-Based Chemotherapy Indicated (CrCl ? 50 mL/min and Minimal or No Symptoms) Therapeutic Status: Pre-Cystectomy or Nonsurgical Candidate (Clinical Staging) AJCC M Category: cM0 AJCC 8 Stage Grouping: IIIA AJCC T Category: cT3 AJCC N Category: cN0 Intent of Therapy: Curative Intent, Discussed with Patient 
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
## 2022-01-04 ENCOUNTER — Ambulatory Visit (HOSPITAL_COMMUNITY): Payer: 59

## 2022-01-04 ENCOUNTER — Encounter (HOSPITAL_COMMUNITY): Payer: Self-pay

## 2022-01-07 NOTE — Progress Notes (Signed)
Pharmacist Chemotherapy Monitoring - Initial Assessment   ? ?Anticipated start date: 01/14/22  ? ?The following has been reviewed per standard work regarding the patient's treatment regimen: ?The patient's diagnosis, treatment plan and drug doses, and organ/hematologic function ?Lab orders and baseline tests specific to treatment regimen  ?The treatment plan start date, drug sequencing, and pre-medications ?Prior authorization status  ?Patient's documented medication list, including drug-drug interaction screen and prescriptions for anti-emetics and supportive care specific to the treatment regimen ?The drug concentrations, fluid compatibility, administration routes, and timing of the medications to be used ?The patient's access for treatment and lifetime cumulative dose history, if applicable  ?The patient's medication allergies and previous infusion related reactions, if applicable  ? ?Changes made to treatment plan:  ?Mg lab ordered to monitor mg with cisplatin ? ?Follow up needed:  ?Pending authorization for treatment  ? ? ?Philomena Course, [place], ?01/07/2022  2:32 PM  ?

## 2022-01-08 ENCOUNTER — Inpatient Hospital Stay: Payer: 59

## 2022-01-08 ENCOUNTER — Other Ambulatory Visit: Payer: Self-pay

## 2022-01-09 ENCOUNTER — Encounter: Payer: Self-pay | Admitting: Oncology

## 2022-01-11 ENCOUNTER — Other Ambulatory Visit: Payer: Self-pay

## 2022-01-11 ENCOUNTER — Encounter: Payer: Self-pay | Admitting: Oncology

## 2022-01-11 NOTE — Progress Notes (Signed)
[place] CSW Progress Note ? ?Clinical Social Worker  received a call from patient  [phone]) to discuss financial resources.  Pt states he will be starting chemotherapy this week and is anticipating he will have co-pays which he will be unable to afford.  Pt has [employer] insurance at present.  According to pt he has been determined to not be eligible for Medicaid and while he has applied for disability, does not expect this to be approved until later this year.  Pt is not yet eligible for Medicare.  Writer provided patient with contact information for Patient Financial Resources as well as contact details for [facility] as well as the Patient [employer] for co-pay assistance.  CSW to continue to provide supportive services as appropriate.   ? ? ? ?Trilby Leaver , LCSW ?

## 2022-01-11 NOTE — Progress Notes (Signed)
Patient called regarding [employer] grant and copay assistance. ? ?Discussed one-time $1000 Radio broadcast assistant to assist with personal expenses while going through treatment. Advised what is needed to apply. ? ?Discussed deductible and OOP. Patient thinks he has met. Advised if he has not, we will review and determine if assistance may be needed and or utilized. He verbalized understanding. ? ?He has my card for any additional financial questions or concerns. ? ?  ?

## 2022-01-12 ENCOUNTER — Other Ambulatory Visit: Payer: Self-pay | Admitting: Radiology

## 2022-01-12 ENCOUNTER — Other Ambulatory Visit (HOSPITAL_COMMUNITY): Payer: Self-pay | Admitting: Physician Assistant

## 2022-01-13 ENCOUNTER — Other Ambulatory Visit: Payer: Self-pay | Admitting: Oncology

## 2022-01-13 ENCOUNTER — Ambulatory Visit (HOSPITAL_COMMUNITY)
Admission: RE | Admit: 2022-01-13 | Discharge: 2022-01-13 | Disposition: A | Discharge status: Home / Self Care | Payer: 59 | Source: Ambulatory Visit | Attending: Oncology | Admitting: Oncology

## 2022-01-13 ENCOUNTER — Encounter (HOSPITAL_COMMUNITY): Payer: Self-pay

## 2022-01-13 DIAGNOSIS — C678 Malignant neoplasm of overlapping sites of bladder: Secondary | ICD-10-CM

## 2022-01-13 DIAGNOSIS — Z8547 Personal history of malignant neoplasm of testis: Secondary | ICD-10-CM | POA: Insufficient documentation

## 2022-01-13 DIAGNOSIS — R69 Illness, unspecified: Secondary | ICD-10-CM | POA: Diagnosis not present

## 2022-01-13 DIAGNOSIS — R338 Other retention of urine: Secondary | ICD-10-CM | POA: Diagnosis not present

## 2022-01-13 DIAGNOSIS — F17210 Nicotine dependence, cigarettes, uncomplicated: Secondary | ICD-10-CM | POA: Diagnosis not present

## 2022-01-13 DIAGNOSIS — Z452 Encounter for adjustment and management of vascular access device: Secondary | ICD-10-CM | POA: Diagnosis not present

## 2022-01-13 DIAGNOSIS — C688 Malignant neoplasm of overlapping sites of urinary organs: Secondary | ICD-10-CM | POA: Diagnosis not present

## 2022-01-13 HISTORY — PX: IR IMAGING GUIDED PORT INSERTION: IMG5740

## 2022-01-13 MED ORDER — MIDAZOLAM HCL 2 MG/2ML IJ SOLN
INTRAMUSCULAR | Status: AC | PRN
  Administered 2022-01-13: 1 mg via INTRAVENOUS

## 2022-01-13 MED ORDER — LIDOCAINE-EPINEPHRINE 1 %-1:100000 IJ SOLN
INTRAMUSCULAR | Status: AC
  Filled 2022-01-13: qty 1

## 2022-01-13 MED ORDER — LIDOCAINE-EPINEPHRINE 1 %-1:100000 IJ SOLN
INTRAMUSCULAR | Status: AC | PRN
  Administered 2022-01-13: 10 mL via INTRADERMAL

## 2022-01-13 MED ORDER — FENTANYL CITRATE (PF) 100 MCG/2ML IJ SOLN
INTRAMUSCULAR | Status: AC | PRN
  Administered 2022-01-13: 50 ug via INTRAVENOUS

## 2022-01-13 MED ORDER — HEPARIN SOD (PORK) LOCK FLUSH 100 UNIT/ML IV SOLN
INTRAVENOUS | Status: AC
  Filled 2022-01-13: qty 5

## 2022-01-13 MED ORDER — HEPARIN SOD (PORK) LOCK FLUSH 100 UNIT/ML IV SOLN
INTRAVENOUS | Status: AC | PRN
  Administered 2022-01-13: 500 [IU] via INTRAVENOUS

## 2022-01-13 MED ORDER — SODIUM CHLORIDE 0.9 % IV SOLN: INTRAVENOUS | Status: DC

## 2022-01-13 MED ORDER — FENTANYL CITRATE (PF) 100 MCG/2ML IJ SOLN
INTRAMUSCULAR | Status: AC
  Filled 2022-01-13: qty 2

## 2022-01-13 MED ORDER — MIDAZOLAM HCL 2 MG/2ML IJ SOLN
INTRAMUSCULAR | Status: AC
  Filled 2022-01-13: qty 4

## 2022-01-13 NOTE — Procedures (Signed)
?  Procedure:  R IJ Port catheter placement   ?Preprocedure diagnosis: The encounter diagnosis was Malignant neoplasm of overlapping sites of bladder ([place]). ? ?Postprocedure diagnosis: same ?EBL:    minimal ?Complications:   none immediate ? ?See full dictation in [facility]. ? ?D. Arne Cleveland MD ?Main # [phone] ?Pager  [phone] ?Mobile [phone] ?  ? ?
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
## 2022-01-14 ENCOUNTER — Other Ambulatory Visit: Payer: Self-pay

## 2022-01-14 ENCOUNTER — Inpatient Hospital Stay: Payer: 59 | Attending: Oncology

## 2022-01-14 ENCOUNTER — Inpatient Hospital Stay: Payer: 59

## 2022-01-14 VITALS — BP 118/72 | HR 70 | Temp 98.1°F | Resp 16 | Wt 194.5 lb

## 2022-01-14 DIAGNOSIS — C679 Malignant neoplasm of bladder, unspecified: Secondary | ICD-10-CM | POA: Insufficient documentation

## 2022-01-14 DIAGNOSIS — Z5111 Encounter for antineoplastic chemotherapy: Secondary | ICD-10-CM | POA: Diagnosis not present

## 2022-01-14 DIAGNOSIS — F17210 Nicotine dependence, cigarettes, uncomplicated: Secondary | ICD-10-CM | POA: Insufficient documentation

## 2022-01-14 DIAGNOSIS — D701 Agranulocytosis secondary to cancer chemotherapy: Secondary | ICD-10-CM | POA: Insufficient documentation

## 2022-01-14 DIAGNOSIS — T451X5A Adverse effect of antineoplastic and immunosuppressive drugs, initial encounter: Secondary | ICD-10-CM | POA: Diagnosis not present

## 2022-01-14 DIAGNOSIS — C678 Malignant neoplasm of overlapping sites of bladder: Secondary | ICD-10-CM

## 2022-01-14 DIAGNOSIS — R69 Illness, unspecified: Secondary | ICD-10-CM | POA: Diagnosis not present

## 2022-01-14 DIAGNOSIS — Z95828 Presence of other vascular implants and grafts: Secondary | ICD-10-CM

## 2022-01-14 LAB — CMP (CANCER CENTER ONLY)
ALT: 9 U/L (ref 0–44)
AST: 13 U/L — ABNORMAL LOW (ref 15–41)
Albumin: 3.9 g/dL (ref 3.5–5.0)
Alkaline Phosphatase: 73 U/L (ref 38–126)
Anion gap: 8 (ref 5–15)
BUN: 13 mg/dL (ref 8–23)
CO2: 27 mmol/L (ref 22–32)
Calcium: 8.8 mg/dL — ABNORMAL LOW (ref 8.9–10.3)
Chloride: 102 mmol/L (ref 98–111)
Creatinine: 0.79 mg/dL (ref 0.61–1.24)
GFR, Estimated: >60 mL/min
Glucose, Bld: 96 mg/dL (ref 70–99)
Potassium: 3.7 mmol/L (ref 3.5–5.1)
Sodium: 137 mmol/L (ref 135–145)
Total Bilirubin: 0.7 mg/dL (ref 0.3–1.2)
Total Protein: 6.7 g/dL (ref 6.5–8.1)

## 2022-01-14 LAB — CBC WITH DIFFERENTIAL (CANCER CENTER ONLY)
Abs Immature Granulocytes: 0.02 10*3/uL (ref 0.00–0.07)
Basophils Absolute: 0 10*3/uL (ref 0.0–0.1)
Basophils Relative: 1 %
Eosinophils Absolute: 0.1 10*3/uL (ref 0.0–0.5)
Eosinophils Relative: 2 %
HCT: 36.8 % — ABNORMAL LOW (ref 39.0–52.0)
Hemoglobin: 12.8 g/dL — ABNORMAL LOW (ref 13.0–17.0)
Immature Granulocytes: 0 %
Lymphocytes Relative: 24 %
Lymphs Abs: 1.5 10*3/uL (ref 0.7–4.0)
MCH: 29.9 pg (ref 26.0–34.0)
MCHC: 34.8 g/dL (ref 30.0–36.0)
MCV: 86 fL (ref 80.0–100.0)
Monocytes Absolute: 0.5 10*3/uL (ref 0.1–1.0)
Monocytes Relative: 8 %
Neutro Abs: 4.1 10*3/uL (ref 1.7–7.7)
Neutrophils Relative %: 65 %
Platelet Count: 205 10*3/uL (ref 150–400)
RBC: 4.28 MIL/uL (ref 4.22–5.81)
RDW: 12.4 % (ref 11.5–15.5)
WBC Count: 6.2 10*3/uL (ref 4.0–10.5)
nRBC: 0 % (ref 0.0–0.2)

## 2022-01-14 LAB — MAGNESIUM: Magnesium: 1.8 mg/dL (ref 1.7–2.4)

## 2022-01-14 MED ORDER — SODIUM CHLORIDE 0.9 % IV SOLN
70.0000 mg/m2 | Freq: Once | INTRAVENOUS | Status: AC
  Administered 2022-01-14: 150 mg via INTRAVENOUS
  Filled 2022-01-14: qty 150

## 2022-01-14 MED ORDER — HEPARIN SOD (PORK) LOCK FLUSH 100 UNIT/ML IV SOLN
500.0000 [IU] | Freq: Once | INTRAVENOUS | Status: AC | PRN
  Administered 2022-01-14: 500 [IU]

## 2022-01-14 MED ORDER — SODIUM CHLORIDE 0.9% FLUSH
10.0000 mL | INTRAVENOUS | Status: AC | PRN
  Administered 2022-01-14: 10 mL

## 2022-01-14 MED ORDER — SODIUM CHLORIDE 0.9 % IV SOLN
1000.0000 mg/m2 | Freq: Once | INTRAVENOUS | Status: AC
  Administered 2022-01-14: 2128 mg via INTRAVENOUS
  Filled 2022-01-14: qty 55.97

## 2022-01-14 MED ORDER — SODIUM CHLORIDE 0.9 % IV SOLN: Freq: Once | INTRAVENOUS | Status: AC

## 2022-01-14 MED ORDER — MAGNESIUM SULFATE 2 GM/50ML IV SOLN
2.0000 g | Freq: Once | INTRAVENOUS | Status: AC
  Administered 2022-01-14: 2 g via INTRAVENOUS
  Filled 2022-01-14: qty 50

## 2022-01-14 MED ORDER — SODIUM CHLORIDE 0.9 % IV SOLN
10.0000 mg | Freq: Once | INTRAVENOUS | Status: AC
  Administered 2022-01-14: 10 mg via INTRAVENOUS
  Filled 2022-01-14: qty 10

## 2022-01-14 MED ORDER — SODIUM CHLORIDE 0.9 % IV SOLN
150.0000 mg | Freq: Once | INTRAVENOUS | Status: AC
  Administered 2022-01-14: 150 mg via INTRAVENOUS
  Filled 2022-01-14: qty 150

## 2022-01-14 MED ORDER — SODIUM CHLORIDE 0.9% FLUSH
10.0000 mL | INTRAVENOUS | Status: DC | PRN
  Administered 2022-01-14: 10 mL

## 2022-01-14 MED ORDER — POTASSIUM CHLORIDE IN NACL 20-0.9 MEQ/L-% IV SOLN
Freq: Once | INTRAVENOUS | Status: AC
  Filled 2022-01-14: qty 1000

## 2022-01-14 MED ORDER — PALONOSETRON HCL INJECTION 0.25 MG/5ML
0.2500 mg | Freq: Once | INTRAVENOUS | Status: AC
  Administered 2022-01-14: 0.25 mg via INTRAVENOUS
  Filled 2022-01-14: qty 5

## 2022-01-14 NOTE — Patient Instructions (Signed)
[facility]   ?Discharge Instructions: ?Thank you for choosing [facility] to provide your oncology and hematology care.  ? ?If you have a lab appointment with the [facility], please go directly to the [facility] and check in at the registration area. ?  ?Wear comfortable clothing and clothing appropriate for easy access to any Portacath or PICC line.  ? ?We strive to give you quality time with your provider. You may need to reschedule your appointment if you arrive late (15 or more minutes).  Arriving late affects you and other patients whose appointments are after yours.  Also, if you miss three or more appointments without notifying the office, you may be dismissed from the clinic at the provider?s discretion.    ?  ?For prescription refill requests, have your pharmacy contact our office and allow 72 hours for refills to be completed.   ? ?Today you received the following chemotherapy and/or immunotherapy agents: gemcitabine and cisplatin    ?  ?To help prevent nausea and vomiting after your treatment, we encourage you to take your nausea medication as directed. ? ?BELOW ARE SYMPTOMS THAT SHOULD BE REPORTED IMMEDIATELY: ?*FEVER GREATER THAN 100.4 F (38 ?C) OR HIGHER ?*CHILLS OR SWEATING ?*NAUSEA AND VOMITING THAT IS NOT CONTROLLED WITH YOUR NAUSEA MEDICATION ?*UNUSUAL SHORTNESS OF BREATH ?*UNUSUAL BRUISING OR BLEEDING ?*URINARY PROBLEMS (pain or burning when urinating, or frequent urination) ?*BOWEL PROBLEMS (unusual diarrhea, constipation, pain near the anus) ?TENDERNESS IN MOUTH AND THROAT WITH OR WITHOUT PRESENCE OF ULCERS (sore throat, sores in mouth, or a toothache) ?UNUSUAL RASH, SWELLING OR PAIN  ?UNUSUAL VAGINAL DISCHARGE OR ITCHING  ? ?Items with * indicate a potential emergency and should be followed up as soon as possible or go to the Emergency Department if any problems should occur. ? ?Please show the CHEMOTHERAPY ALERT CARD or IMMUNOTHERAPY ALERT  CARD at check-in to the Emergency Department and triage nurse. ? ?Should you have questions after your visit or need to cancel or reschedule your appointment, please contact [facility]  Dept: [phone]  and follow the prompts.  Office hours are 8:00 a.m. to 4:30 p.m. Monday - Friday. Please note that voicemails left after 4:00 p.m. may not be returned until the following business day.  We are closed weekends and major holidays. You have access to a nurse at all times for urgent questions. Please call the main number to the clinic Dept: [phone] and follow the prompts. ? ? ?For any non-urgent questions, you may also contact your provider using MyChart. We now offer e-Visits for anyone 75 and older to request care online for non-urgent symptoms. For details visit [email or web address]. ?  ?Also download the MyChart app! Go to the app store, search "MyChart", open the app, select [facility], and log in with your MyChart username and password. ? ?Due to Covid, a mask is required upon entering the hospital/clinic. If you do not have a mask, one will be given to you upon arrival. For doctor visits, patients may have 1 support person aged 76 or older with them. For treatment visits, patients cannot have anyone with them due to current Covid guidelines and our immunocompromised population.  ? ?Gemcitabine injection ?What is this medication? ?GEMCITABINE (jem SYE ta been) is a chemotherapy drug. This medicine is used to treat many types of cancer like breast cancer, lung cancer, pancreatic cancer, and ovarian cancer. ?This medicine may be used for other purposes; ask your health  care provider or pharmacist if you have questions. ?COMMON BRAND NAME(S): Gemzar, Infugem ?What should I tell my care team before I take this medication? ?They need to know if you have any of these conditions: ?blood disorders ?infection ?kidney disease ?liver disease ?lung or breathing disease, like  asthma ?recent or ongoing radiation therapy ?an unusual or allergic reaction to gemcitabine, other chemotherapy, other medicines, foods, dyes, or preservatives ?pregnant or trying to get pregnant ?breast-feeding ?How should I use this medication? ?This drug is given as an infusion into a vein. It is administered in a hospital or clinic by a specially trained health care professional. ?Talk to your pediatrician regarding the use of this medicine in children. Special care may be needed. ?Overdosage: If you think you have taken too much of this medicine contact a poison control center or emergency room at once. ?NOTE: This medicine is only for you. Do not share this medicine with others. ?What if I miss a dose? ?It is important not to miss your dose. Call your doctor or health care professional if you are unable to keep an appointment. ?What may interact with this medication? ?medicines to increase blood counts like filgrastim, pegfilgrastim, sargramostim ?some other chemotherapy drugs like cisplatin ?vaccines ?Talk to your doctor or health care professional before taking any of these medicines: ?acetaminophen ?aspirin ?ibuprofen ?ketoprofen ?naproxen ?This list may not describe all possible interactions. Give your health care provider a list of all the medicines, herbs, non-prescription drugs, or dietary supplements you use. Also tell them if you smoke, drink alcohol, or use illegal drugs. Some items may interact with your medicine. ?What should I watch for while using this medication? ?Visit your doctor for checks on your progress. This drug may make you feel generally unwell. This is not uncommon, as chemotherapy can affect healthy cells as well as cancer cells. Report any side effects. Continue your course of treatment even though you feel ill unless your doctor tells you to stop. ?In some cases, you may be given additional medicines to help with side effects. Follow all directions for their use. ?Call your doctor  or health care professional for advice if you get a fever, chills or sore throat, or other symptoms of a cold or flu. Do not treat yourself. This drug decreases your body's ability to fight infections. Try to avoid being around people who are sick. ?This medicine may increase your risk to bruise or bleed. Call your doctor or health care professional if you notice any unusual bleeding. ?Be careful brushing and flossing your teeth or using a toothpick because you may get an infection or bleed more easily. If you have any dental work done, tell your dentist you are receiving this medicine. ?Avoid taking products that contain aspirin, acetaminophen, ibuprofen, naproxen, or ketoprofen unless instructed by your doctor. These medicines may hide a fever. ?Do not become pregnant while taking this medicine or for 6 months after stopping it. Women should inform their doctor if they wish to become pregnant or think they might be pregnant. Men should not father a child while taking this medicine and for 3 months after stopping it. There is a potential for serious side effects to an unborn child. Talk to your health care professional or pharmacist for more information. Do not breast-feed an infant while taking this medicine or for at least 1 week after stopping it. ?Men should inform their doctors if they wish to father a child. This medicine may lower sperm counts. Talk with your doctor  or health care professional if you are concerned about your fertility. ?What side effects may I notice from receiving this medication? ?Side effects that you should report to your doctor or health care professional as soon as possible: ?allergic reactions like skin rash, itching or hives, swelling of the face, lips, or tongue ?breathing problems ?pain, redness, or irritation at site where injected ?signs and symptoms of a dangerous change in heartbeat or heart rhythm like chest pain; dizziness; fast or irregular heartbeat; palpitations; feeling  faint or lightheaded, falls; breathing problems ?signs of decreased platelets or bleeding - bruising, pinpoint red spots on the skin, black, tarry stools, blood in the urine ?signs of decreased red blood cells -

## 2022-01-15 ENCOUNTER — Ambulatory Visit (HOSPITAL_COMMUNITY)
Admission: RE | Admit: 2022-01-15 | Discharge: 2022-01-15 | Disposition: A | Discharge status: Home / Self Care | Payer: 59 | Source: Ambulatory Visit | Attending: Urology | Admitting: Urology

## 2022-01-15 ENCOUNTER — Telehealth: Payer: Self-pay

## 2022-01-15 DIAGNOSIS — C671 Malignant neoplasm of dome of bladder: Secondary | ICD-10-CM | POA: Insufficient documentation

## 2022-01-15 LAB — GLUCOSE, CAPILLARY: Glucose-Capillary: 141 mg/dL — ABNORMAL HIGH (ref 70–99)

## 2022-01-15 MED ORDER — FLUDEOXYGLUCOSE F - 18 (FDG) INJECTION
9.4800 | Freq: Once | INTRAVENOUS | Status: AC
  Administered 2022-01-15: 9.48 via INTRAVENOUS

## 2022-01-15 NOTE — Telephone Encounter (Deleted)
-----   Message from Derrick Ravel, RN sent at 01/14/2022  3:55 PM EDT ----- ?Regarding: First time gemzar/cisplatin, Dr. Alen Blew patient. ?First time gemzar/cisplatin infusion, Dr. Alen Blew patient. He tolerated infusion well, please give follow up phone tomorrow. Thank you!! ? ?

## 2022-01-15 NOTE — Telephone Encounter (Signed)
Mr Bialecki stated that he is eating, drinking and urinating well. He has not had any N/V. Doing well over all. He knows to call the office at [phone] if he has any questions or concerns. ?

## 2022-01-15 NOTE — Telephone Encounter (Signed)
-----   Message from Derrick Ravel, RN sent at 01/14/2022  3:55 PM EDT ----- ?Regarding: First time gemzar/cisplatin, Dr. Alen Blew patient. ?First time gemzar/cisplatin infusion, Dr. Alen Blew patient. He tolerated infusion well, please give follow up phone tomorrow. Thank you!! ? ?

## 2022-01-16 ENCOUNTER — Other Ambulatory Visit: Payer: Self-pay

## 2022-01-16 ENCOUNTER — Emergency Department (HOSPITAL_COMMUNITY)
Admission: EM | Admit: 2022-01-16 | Discharge: 2022-01-16 | Disposition: A | Discharge status: Home / Self Care | Payer: 59 | Attending: Emergency Medicine | Admitting: Emergency Medicine

## 2022-01-16 ENCOUNTER — Encounter (HOSPITAL_COMMUNITY): Payer: Self-pay

## 2022-01-16 DIAGNOSIS — Z8551 Personal history of malignant neoplasm of bladder: Secondary | ICD-10-CM | POA: Insufficient documentation

## 2022-01-16 DIAGNOSIS — K5900 Constipation, unspecified: Secondary | ICD-10-CM | POA: Diagnosis not present

## 2022-01-16 DIAGNOSIS — R339 Retention of urine, unspecified: Secondary | ICD-10-CM | POA: Diagnosis not present

## 2022-01-16 LAB — URINALYSIS, ROUTINE W REFLEX MICROSCOPIC
Bilirubin Urine: NEGATIVE
Glucose, UA: NEGATIVE mg/dL
Ketones, ur: NEGATIVE mg/dL
Nitrite: NEGATIVE
Protein, ur: NEGATIVE mg/dL
Specific Gravity, Urine: 1.008 (ref 1.005–1.030)
pH: 6 (ref 5.0–8.0)

## 2022-01-16 MED ORDER — GLYCERIN (ADULT) 2 G RE SUPP: 1.0000 | RECTAL | 0 refills | Status: DC | PRN

## 2022-01-16 MED ORDER — POLYETHYLENE GLYCOL 3350 17 GM/SCOOP PO POWD: 17.0000 g | Freq: Every day | ORAL | 0 refills | Status: DC

## 2022-01-16 NOTE — ED Provider Notes (Signed)
?[facility] DEPT ?Provider Note ? ? ?CSN: [id number] ?Arrival date & time: 01/16/22  0605 ? ?  ? ?History ? ?Chief Complaint  ?Patient presents with  ? Constipation  ? ? ?Jonathan Ray is a 66 y.o. male. ? ?Patient with history of bladder cancer, metastatic, PET scan yesterday showing large bladder tumor with hypermetabolic lymph nodes and signs of peritoneal carcinomatosis --presents to the emergency department for evaluation of urinary retention.  Patient also reports having hard small bowel movements over the past several days.  Patient is followed by urology.  He was recently given a prescription for tramadol.  He has taken 2 doses of this.  He states that he has had increasing lower abdominal tenderness and fullness starting last night and although was able to urinate a small amount at first, has been unable to urinate since that time.  He has a history of urinary retention.  No reported fevers, chest pain, shortness of breath, vomiting.   ? ? ?  ? ?Home Medications ?Prior to Admission medications   ?Medication Sig Start Date End Date Taking? Authorizing Provider  ?diazepam (VALIUM) 10 MG tablet Take 20 mg by mouth daily. 12/01/21   [provider]  ?ibuprofen (ADVIL) 200 MG tablet Take 200 mg by mouth every 6 (six) hours as needed.    [provider]  ?lidocaine-prilocaine (EMLA) cream Apply 1 application. topically as needed. 01/01/22   [name], MD  ?naproxen sodium (ALEVE) 220 MG tablet Take 220 mg by mouth.    [provider]  ?prochlorperazine (COMPAZINE) 10 MG tablet Take 1 tablet (10 mg total) by mouth every 6 (six) hours as needed for nausea or vomiting. 01/01/22   [name], MD  ?   ? ?Allergies    ?Penicillins   ? ?Review of Systems   ?Review of Systems ? ?Physical Exam ?Updated Vital Signs ?BP (!) 158/82 (BP Location: Left Arm)   Pulse 98   Temp 98.6 ?F (37 ?C) (Oral)   Resp 15   SpO2 100%  ? ?Physical Exam ?Vitals and nursing  note reviewed.  ?Constitutional:   ?   General: He is not in acute distress. ?   Appearance: He is well-developed.  ?HENT:  ?   Head: Normocephalic and atraumatic.  ?Eyes:  ?   General:     ?   Right eye: No discharge.     ?   Left eye: No discharge.  ?   Conjunctiva/sclera: Conjunctivae normal.  ?Cardiovascular:  ?   Rate and Rhythm: Normal rate and regular rhythm.  ?   Heart sounds: Normal heart sounds.  ?Pulmonary:  ?   Effort: Pulmonary effort is normal.  ?   Breath sounds: Normal breath sounds.  ?Abdominal:  ?   General: There is distension.  ?   Palpations: Abdomen is soft.  ?   Tenderness: There is abdominal tenderness.  ?   Comments: Patient with lower abdominal tenderness and distention.  Unclear if this is related to tumor or bladder distention.   ?Musculoskeletal:  ?   Cervical back: Normal range of motion and neck supple.  ?Skin: ?   General: Skin is warm and dry.  ?Neurological:  ?   Mental Status: He is alert.  ? ? ?ED Results / Procedures / Treatments   ?Labs ?(all labs ordered are listed, but only abnormal results are displayed) ?Labs Reviewed  ?URINALYSIS, ROUTINE W REFLEX MICROSCOPIC - Abnormal; Notable for the following components:  ?  Result Value  ? Color, Urine STRAW (*)   ? Hgb urine dipstick MODERATE (*)   ? Leukocytes,Ua MODERATE (*)   ? Bacteria, UA RARE (*)   ? All other components within normal limits  ? ? ?EKG ?None ? ?Radiology ?NM PET Image Initial (PI) Skull Base To Thigh (F-18 FDG) ? ?Result Date: 01/15/2022 ?CLINICAL DATA:  Subsequent treatment strategy for bladder cancer. Initial diagnosis 2021 history of partial cystectomy. EXAM: NUCLEAR MEDICINE PET SKULL BASE TO THIGH TECHNIQUE: 9.48 mCi F-18 FDG was injected intravenously. Full-ring PET imaging was performed from the skull base to thigh after the radiotracer. CT data was obtained and used for attenuation correction and anatomic localization. Fasting blood glucose: 141 mg/dl COMPARISON:  CT scan 02/01/2020 and MRI pelvis  12/12/2021 FINDINGS: Mediastinal blood pool activity: SUV max 2.04 Liver activity: SUV max NA NECK: No hypermetabolic lymph nodes in the neck. Incidental CT findings: Bilateral carotid artery calcifications are noted. CHEST: No hypermetabolic mediastinal or hilar nodes. No suspicious pulmonary nodules on the CT scan. No hypermetabolic chest wall mass, supraclavicular or axillary adenopathy. Incidental CT findings: Advanced emphysematous changes. No acute pulmonary findings or worrisome pulmonary lesions or nodules. The Port-A-Cath is in good position without complicating features. Age advanced aortic and coronary artery calcifications. ABDOMEN/PELVIS: There are several hypermetabolic omental lesions consistent with peritoneal carcinomatosis. In the right greater omentum on image number 126/4 there is a 17 mm nodule which has an SUV max of 8.0. Cluster of 4 omental nodules in the midline 10.14. Left-sided omental nodule on image 141/4 measures 2.6 cm and SUV max is 6.36. Several other smaller omental nodules are noted. Small hypermetabolic pelvic sidewall lymph nodes consistent with metastatic adenopathy. Left-sided node on image 177/4 measures 8 mm and the SUV max is 4.10. The largest right-sided node on image 178/4 measures 10 mm and the SUV max is 4.42. Large irregular bulky mass at the dome region the bladder appears to extend into the adjacent fat. This is consistent with hypermetabolic tumor. SUV max is 12.13. No findings for metastatic disease involving the liver adrenal glands or spleen. The pancreas and kidneys are unremarkable. Incidental CT findings: Small scattered hepatic cysts are noted. Stable age advanced atherosclerotic calcifications involving the aorta and branch vessels. No aneurysm. SKELETON: No findings suspicious for osseous metastatic disease. Incidental CT findings: none IMPRESSION: 1. Large bulky hypermetabolic tumor involving the dome of the bladder with findings suspicious for extension  outside the bladder and into the adjacent pericystic fat. 2. Hypermetabolic pelvic nodes consistent with metastatic disease. 3. 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 4. No findings for metastatic disease involving the chest, solid abdominal organs or bony structures. Electronically Signed   By: Marijo Sanes M.D.   On: 01/15/2022 11:07   ? ?Procedures ?Procedures  ? ? ?Medications Ordered in ED ?Medications - No data to display ? ?ED Course/ Medical Decision Making/ A&P ?  ? ?Patient seen and examined. History obtained directly from patient and review of previous oncology notes. ? ?Labs/EKG: Ordered UA. ? ?Imaging: Ordered bladder scan. ? ?Medications/Fluids: None ordered ? ?Most recent vital signs reviewed and are as follows: ?BP (!) 143/74 (BP Location: Left Arm)   Pulse 81   Temp 98.6 ?F (37 ?C) (Oral)   Resp 16   SpO2 99%  ? ?Initial impression: Likely acute urinary retention and constipation ? ?Bladder scan reported at nearly 300 cc.  Discussed with RN.  If patient is unable to urinate or only passes a  small amount of urine, place Foley catheter.  Foley catheter was placed. ? ?8:58 AM Reassessment performed. Patient appears much more comfortable.  Greater than 500 cc of urine out with Foley catheter.  Patient had immediate relief. ? ?Labs personally reviewed and interpreted including: UA which does not demonstrate compelling sign of infection. ? ?Reviewed pertinent lab work and imaging with patient at bedside. Questions answered.  ? ?Most current vital signs reviewed and are as follows: ?BP (!) 143/74 (BP Location: Left Arm)   Pulse 81   Temp 98.6 ?F (37 ?C) (Oral)   Resp 16   SpO2 99%  ? ?Plan: Discharge to home.  ? ?Prescriptions written for: MiraLAX and glycerin suppositories if he would like something to use for constipation.  Encourage good hydration. ? ?ED return instructions discussed: Return with worsening abdominal pain, vomiting,  fever. ? ?Follow-up instructions discussed: Patient encouraged to follow-up with urologist in 1 week for voiding trial. ? ? ?                        ?Medical Decision Making ?Amount and/or Complexity of Data Reviewed ?Labs: ordered. ? ?Risk ?

## 2022-01-16 NOTE — Discharge Instructions (Signed)
Please read and follow all provided instructions. ? ?Your diagnoses today include:  ?1. Urinary retention   ?2. Constipation, unspecified constipation type   ? ? ?Tests performed today include: ?Urine test to look for infection: No definite infection ?Vital signs. See below for your results today.  ? ?Medications prescribed:  ?Miralax - laxative ? ?This medication can be found over-the-counter.  ? ?Glycerin suppository - medication to help with constipation ? ?Take any prescribed medications only as directed. ? ?Home care instructions:  ?Follow any educational materials contained in this packet. ? ?Follow-up instructions: ?Please follow-up with your urologist to determine if catheter can be removed. ? ?Return instructions:  ?SEEK IMMEDIATE MEDICAL ATTENTION IF: ?The pain does not go away or becomes severe  ?A temperature above 101F develops  ?Repeated vomiting occurs (multiple episodes)  ?The pain becomes localized to portions of the abdomen. The right side could possibly be appendicitis. In an adult, the left lower portion of the abdomen could be colitis or diverticulitis.  ?Blood is being passed in stools or vomit (bright red or black tarry stools)  ?You develop chest pain, difficulty breathing, dizziness or fainting, or become confused, poorly responsive, or inconsolable (young children) ?If you have any other emergent concerns regarding your health ? ?Additional Information: ?Abdominal (belly) pain can be caused by many things. Your caregiver performed an examination and possibly ordered blood/urine tests and imaging (CT scan, x-rays, ultrasound). Many cases can be observed and treated at home after initial evaluation in the emergency department. Even though you are being discharged home, abdominal pain can be unpredictable. Therefore, you need a repeated exam if your pain does not resolve, returns, or worsens. Most patients with abdominal pain don't have to be admitted to the hospital or have surgery, but  serious problems like appendicitis and gallbladder attacks can start out as nonspecific pain. Many abdominal conditions cannot be diagnosed in one visit, so follow-up evaluations are very important. ? ?Your vital signs today were: ?BP (!) 143/74 (BP Location: Left Arm)   Pulse 81   Temp 98.6 ?F (37 ?C) (Oral)   Resp 16   SpO2 99%  ?If your blood pressure (bp) was elevated above 135/85 this visit, please have this repeated by your doctor within one month. ?-------------- ? ?

## 2022-01-16 NOTE — ED Triage Notes (Addendum)
Patient said he is constipated. This morning was the 2nd time he took tramadol. He feels "locked up." Last bowel movement was last night but he said it was small and dry. Then he also said he has not been able to urinate fully since last night, but he could "dribble" 45 minutes ago. He said his flow is not normal.  ?

## 2022-01-16 NOTE — ED Notes (Signed)
ED Provider at bedside. 

## 2022-01-16 NOTE — ED Notes (Addendum)
Pt shown to lobby. Verbalized understanding discharge instructions, prescriptions, and follow-up. In no acute distress.  ? ?Changed standard drainage bag from kit to standard drainage bag w/o hard plastic reservoir per Pt request.  Stat lock still in place.  ?

## 2022-01-21 ENCOUNTER — Inpatient Hospital Stay: Payer: 59

## 2022-01-21 ENCOUNTER — Other Ambulatory Visit: Payer: Self-pay

## 2022-01-21 VITALS — BP 140/77 | HR 66 | Temp 98.4°F | Resp 15 | Wt 192.0 lb

## 2022-01-21 DIAGNOSIS — C679 Malignant neoplasm of bladder, unspecified: Secondary | ICD-10-CM

## 2022-01-21 DIAGNOSIS — T451X5A Adverse effect of antineoplastic and immunosuppressive drugs, initial encounter: Secondary | ICD-10-CM | POA: Diagnosis not present

## 2022-01-21 DIAGNOSIS — R69 Illness, unspecified: Secondary | ICD-10-CM | POA: Diagnosis not present

## 2022-01-21 DIAGNOSIS — D701 Agranulocytosis secondary to cancer chemotherapy: Secondary | ICD-10-CM | POA: Diagnosis not present

## 2022-01-21 DIAGNOSIS — Z5111 Encounter for antineoplastic chemotherapy: Secondary | ICD-10-CM | POA: Diagnosis not present

## 2022-01-21 DIAGNOSIS — C678 Malignant neoplasm of overlapping sites of bladder: Secondary | ICD-10-CM

## 2022-01-21 DIAGNOSIS — Z95828 Presence of other vascular implants and grafts: Secondary | ICD-10-CM

## 2022-01-21 LAB — CBC WITH DIFFERENTIAL (CANCER CENTER ONLY)
Abs Immature Granulocytes: 0.01 10*3/uL (ref 0.00–0.07)
Basophils Absolute: 0 10*3/uL (ref 0.0–0.1)
Basophils Relative: 1 %
Eosinophils Absolute: 0 10*3/uL (ref 0.0–0.5)
Eosinophils Relative: 1 %
HCT: 33.3 % — ABNORMAL LOW (ref 39.0–52.0)
Hemoglobin: 11.4 g/dL — ABNORMAL LOW (ref 13.0–17.0)
Immature Granulocytes: 0 %
Lymphocytes Relative: 38 %
Lymphs Abs: 1.2 10*3/uL (ref 0.7–4.0)
MCH: 29.3 pg (ref 26.0–34.0)
MCHC: 34.2 g/dL (ref 30.0–36.0)
MCV: 85.6 fL (ref 80.0–100.0)
Monocytes Absolute: 0.2 10*3/uL (ref 0.1–1.0)
Monocytes Relative: 5 %
Neutro Abs: 1.8 10*3/uL (ref 1.7–7.7)
Neutrophils Relative %: 55 %
Platelet Count: 125 10*3/uL — ABNORMAL LOW (ref 150–400)
RBC: 3.89 MIL/uL — ABNORMAL LOW (ref 4.22–5.81)
RDW: 11.8 % (ref 11.5–15.5)
WBC Count: 3.3 10*3/uL — ABNORMAL LOW (ref 4.0–10.5)
nRBC: 0 % (ref 0.0–0.2)

## 2022-01-21 LAB — CMP (CANCER CENTER ONLY)
ALT: 12 U/L (ref 0–44)
AST: 14 U/L — ABNORMAL LOW (ref 15–41)
Albumin: 3.8 g/dL (ref 3.5–5.0)
Alkaline Phosphatase: 59 U/L (ref 38–126)
Anion gap: 5 (ref 5–15)
BUN: 7 mg/dL — ABNORMAL LOW (ref 8–23)
CO2: 29 mmol/L (ref 22–32)
Calcium: 8.8 mg/dL — ABNORMAL LOW (ref 8.9–10.3)
Chloride: 98 mmol/L (ref 98–111)
Creatinine: 0.71 mg/dL (ref 0.61–1.24)
GFR, Estimated: >60 mL/min (ref >60)
Glucose, Bld: 97 mg/dL (ref 70–99)
Potassium: 3.8 mmol/L (ref 3.5–5.1)
Sodium: 132 mmol/L — ABNORMAL LOW (ref 135–145)
Total Bilirubin: 0.7 mg/dL (ref 0.3–1.2)
Total Protein: 6.5 g/dL (ref 6.5–8.1)

## 2022-01-21 MED ORDER — HEPARIN SOD (PORK) LOCK FLUSH 100 UNIT/ML IV SOLN
500.0000 [IU] | Freq: Once | INTRAVENOUS | Status: AC | PRN
  Administered 2022-01-21: 500 [IU]

## 2022-01-21 MED ORDER — SODIUM CHLORIDE 0.9% FLUSH
10.0000 mL | Freq: Once | INTRAVENOUS | Status: AC
  Administered 2022-01-21: 10 mL

## 2022-01-21 MED ORDER — SODIUM CHLORIDE 0.9% FLUSH
10.0000 mL | INTRAVENOUS | Status: DC | PRN
  Administered 2022-01-21: 10 mL

## 2022-01-21 MED ORDER — PROCHLORPERAZINE MALEATE 10 MG PO TABS
10.0000 mg | ORAL_TABLET | Freq: Once | ORAL | Status: AC
  Administered 2022-01-21: 10 mg via ORAL
  Filled 2022-01-21: qty 1

## 2022-01-21 MED ORDER — SODIUM CHLORIDE 0.9 % IV SOLN
1000.0000 mg/m2 | Freq: Once | INTRAVENOUS | Status: AC
  Administered 2022-01-21: 2128 mg via INTRAVENOUS
  Filled 2022-01-21: qty 55.97

## 2022-01-21 MED ORDER — SODIUM CHLORIDE 0.9 % IV SOLN: Freq: Once | INTRAVENOUS | Status: AC

## 2022-01-21 NOTE — Patient Instructions (Signed)
[facility]  Discharge Instructions: Thank you for choosing [facility] to provide your oncology and hematology care.   If you have a lab appointment with the Cancer Center, please go directly to the Cancer Center and check in at the registration area.   Wear comfortable clothing and clothing appropriate for easy access to any Portacath or PICC line.   We strive to give you quality time with your provider. You may need to reschedule your appointment if you arrive late (15 or more minutes).  Arriving late affects you and other patients whose appointments are after yours.  Also, if you miss three or more appointments without notifying the office, you may be dismissed from the clinic at the provider's discretion.      For prescription refill requests, have your pharmacy contact our office and allow 72 hours for refills to be completed.    Today you received the following chemotherapy and/or immunotherapy agents Gemzar      To help prevent nausea and vomiting after your treatment, we encourage you to take your nausea medication as directed.  BELOW ARE SYMPTOMS THAT SHOULD BE REPORTED IMMEDIATELY: *FEVER GREATER THAN 100.4 F (38 C) OR HIGHER *CHILLS OR SWEATING *NAUSEA AND VOMITING THAT IS NOT CONTROLLED WITH YOUR NAUSEA MEDICATION *UNUSUAL SHORTNESS OF BREATH *UNUSUAL BRUISING OR BLEEDING *URINARY PROBLEMS (pain or burning when urinating, or frequent urination) *BOWEL PROBLEMS (unusual diarrhea, constipation, pain near the anus) TENDERNESS IN MOUTH AND THROAT WITH OR WITHOUT PRESENCE OF ULCERS (sore throat, sores in mouth, or a toothache) UNUSUAL RASH, SWELLING OR PAIN  UNUSUAL VAGINAL DISCHARGE OR ITCHING   Items with * indicate a potential emergency and should be followed up as soon as possible or go to the Emergency Department if any problems should occur.  Please show the CHEMOTHERAPY ALERT CARD or IMMUNOTHERAPY ALERT CARD at check-in to the  Emergency Department and triage nurse.  Should you have questions after your visit or need to cancel or reschedule your appointment, please contact [facility]  Dept: [phone]  and follow the prompts.  Office hours are 8:00 a.m. to 4:30 p.m. Monday - Friday. Please note that voicemails left after 4:00 p.m. may not be returned until the following business day.  We are closed weekends and major holidays. You have access to a nurse at all times for urgent questions. Please call the main number to the clinic Dept: [phone] and follow the prompts.   For any non-urgent questions, you may also contact your provider using MyChart. We now offer e-Visits for anyone 18 and older to request care online for non-urgent symptoms. For details visit [email or web address].   Also download the MyChart app! Go to the app store, search "MyChart", open the app, select , and log in with your MyChart username and password.  Due to Covid, a mask is required upon entering the hospital/clinic. If you do not have a mask, one will be given to you upon arrival. For doctor visits, patients may have 1 support person aged 18 or older with them. For treatment visits, patients cannot have anyone with them due to current Covid guidelines and our immunocompromised population.   

## 2022-01-25 DIAGNOSIS — R338 Other retention of urine: Secondary | ICD-10-CM | POA: Diagnosis not present

## 2022-01-25 DIAGNOSIS — C775 Secondary and unspecified malignant neoplasm of intrapelvic lymph nodes: Secondary | ICD-10-CM | POA: Diagnosis not present

## 2022-01-25 DIAGNOSIS — C671 Malignant neoplasm of dome of bladder: Secondary | ICD-10-CM | POA: Diagnosis not present

## 2022-01-28 DIAGNOSIS — R338 Other retention of urine: Secondary | ICD-10-CM | POA: Diagnosis not present

## 2022-02-04 ENCOUNTER — Other Ambulatory Visit: Payer: Self-pay | Admitting: *Deleted

## 2022-02-04 ENCOUNTER — Other Ambulatory Visit: Payer: Self-pay

## 2022-02-04 ENCOUNTER — Inpatient Hospital Stay: Payer: 59

## 2022-02-04 ENCOUNTER — Inpatient Hospital Stay (HOSPITAL_BASED_OUTPATIENT_CLINIC_OR_DEPARTMENT_OTHER): Payer: 59 | Admitting: Oncology

## 2022-02-04 VITALS — BP 134/70 | HR 71 | Temp 98.0°F | Resp 15 | Ht 73.0 in | Wt 195.3 lb

## 2022-02-04 DIAGNOSIS — Z5111 Encounter for antineoplastic chemotherapy: Secondary | ICD-10-CM | POA: Diagnosis not present

## 2022-02-04 DIAGNOSIS — D701 Agranulocytosis secondary to cancer chemotherapy: Secondary | ICD-10-CM | POA: Diagnosis not present

## 2022-02-04 DIAGNOSIS — C679 Malignant neoplasm of bladder, unspecified: Secondary | ICD-10-CM

## 2022-02-04 DIAGNOSIS — C678 Malignant neoplasm of overlapping sites of bladder: Secondary | ICD-10-CM

## 2022-02-04 DIAGNOSIS — T451X5A Adverse effect of antineoplastic and immunosuppressive drugs, initial encounter: Secondary | ICD-10-CM | POA: Diagnosis not present

## 2022-02-04 DIAGNOSIS — R69 Illness, unspecified: Secondary | ICD-10-CM | POA: Diagnosis not present

## 2022-02-04 DIAGNOSIS — Z95828 Presence of other vascular implants and grafts: Secondary | ICD-10-CM

## 2022-02-04 LAB — CBC WITH DIFFERENTIAL (CANCER CENTER ONLY)
Abs Immature Granulocytes: 0.01 10*3/uL (ref 0.00–0.07)
Basophils Absolute: 0 10*3/uL (ref 0.0–0.1)
Basophils Relative: 0 %
Eosinophils Absolute: 0 10*3/uL (ref 0.0–0.5)
Eosinophils Relative: 1 %
HCT: 32.1 % — ABNORMAL LOW (ref 39.0–52.0)
Hemoglobin: 11.2 g/dL — ABNORMAL LOW (ref 13.0–17.0)
Immature Granulocytes: 0 %
Lymphocytes Relative: 37 %
Lymphs Abs: 1.3 10*3/uL (ref 0.7–4.0)
MCH: 30 pg (ref 26.0–34.0)
MCHC: 34.9 g/dL (ref 30.0–36.0)
MCV: 86.1 fL (ref 80.0–100.0)
Monocytes Absolute: 0.7 10*3/uL (ref 0.1–1.0)
Monocytes Relative: 19 %
Neutro Abs: 1.5 10*3/uL — ABNORMAL LOW (ref 1.7–7.7)
Neutrophils Relative %: 43 %
Platelet Count: 475 10*3/uL — ABNORMAL HIGH (ref 150–400)
RBC: 3.73 MIL/uL — ABNORMAL LOW (ref 4.22–5.81)
RDW: 12.5 % (ref 11.5–15.5)
WBC Count: 3.5 10*3/uL — ABNORMAL LOW (ref 4.0–10.5)
nRBC: 0 % (ref 0.0–0.2)

## 2022-02-04 LAB — CMP (CANCER CENTER ONLY)
ALT: 8 U/L (ref 0–44)
AST: 11 U/L — ABNORMAL LOW (ref 15–41)
Albumin: 3.8 g/dL (ref 3.5–5.0)
Alkaline Phosphatase: 74 U/L (ref 38–126)
Anion gap: 6 (ref 5–15)
BUN: 8 mg/dL (ref 8–23)
CO2: 29 mmol/L (ref 22–32)
Calcium: 9.2 mg/dL (ref 8.9–10.3)
Chloride: 102 mmol/L (ref 98–111)
Creatinine: 0.76 mg/dL (ref 0.61–1.24)
GFR, Estimated: >60 mL/min (ref >60)
Glucose, Bld: 94 mg/dL (ref 70–99)
Potassium: 3.8 mmol/L (ref 3.5–5.1)
Sodium: 137 mmol/L (ref 135–145)
Total Bilirubin: 0.3 mg/dL (ref 0.3–1.2)
Total Protein: 6.4 g/dL — ABNORMAL LOW (ref 6.5–8.1)

## 2022-02-04 LAB — MAGNESIUM: Magnesium: 1.8 mg/dL (ref 1.7–2.4)

## 2022-02-04 MED ORDER — HEPARIN SOD (PORK) LOCK FLUSH 100 UNIT/ML IV SOLN
500.0000 [IU] | Freq: Once | INTRAVENOUS | Status: AC | PRN
  Administered 2022-02-04: 500 [IU]

## 2022-02-04 MED ORDER — SODIUM CHLORIDE 0.9 % IV SOLN
150.0000 mg | Freq: Once | INTRAVENOUS | Status: AC
  Administered 2022-02-04: 150 mg via INTRAVENOUS
  Filled 2022-02-04: qty 150

## 2022-02-04 MED ORDER — POTASSIUM CHLORIDE IN NACL 20-0.9 MEQ/L-% IV SOLN
Freq: Once | INTRAVENOUS | Status: AC
  Filled 2022-02-04: qty 1000

## 2022-02-04 MED ORDER — MAGNESIUM SULFATE 2 GM/50ML IV SOLN
2.0000 g | Freq: Once | INTRAVENOUS | Status: AC
  Administered 2022-02-04: 2 g via INTRAVENOUS
  Filled 2022-02-04: qty 50

## 2022-02-04 MED ORDER — SODIUM CHLORIDE 0.9% FLUSH
10.0000 mL | Freq: Once | INTRAVENOUS | Status: AC
  Administered 2022-02-04: 10 mL

## 2022-02-04 MED ORDER — SODIUM CHLORIDE 0.9 % IV SOLN: Freq: Once | INTRAVENOUS | Status: AC

## 2022-02-04 MED ORDER — SODIUM CHLORIDE 0.9 % IV SOLN
800.0000 mg/m2 | Freq: Once | INTRAVENOUS | Status: AC
  Administered 2022-02-04: 1710 mg via INTRAVENOUS
  Filled 2022-02-04: qty 44.97

## 2022-02-04 MED ORDER — SODIUM CHLORIDE 0.9 % IV SOLN
10.0000 mg | Freq: Once | INTRAVENOUS | Status: AC
  Administered 2022-02-04: 10 mg via INTRAVENOUS
  Filled 2022-02-04: qty 10

## 2022-02-04 MED ORDER — SODIUM CHLORIDE 0.9 % IV SOLN
70.0000 mg/m2 | Freq: Once | INTRAVENOUS | Status: AC
  Administered 2022-02-04: 150 mg via INTRAVENOUS
  Filled 2022-02-04: qty 150

## 2022-02-04 MED ORDER — SODIUM CHLORIDE 0.9% FLUSH
10.0000 mL | INTRAVENOUS | Status: DC | PRN
  Administered 2022-02-04: 10 mL

## 2022-02-04 MED ORDER — PALONOSETRON HCL INJECTION 0.25 MG/5ML
0.2500 mg | Freq: Once | INTRAVENOUS | Status: AC
  Administered 2022-02-04: 0.25 mg via INTRAVENOUS
  Filled 2022-02-04: qty 5

## 2022-02-04 NOTE — Patient Instructions (Signed)
[facility]  Discharge Instructions: ?Thank you for choosing [facility] to provide your oncology and hematology care.  ? ?If you have a lab appointment with the [facility], please go directly to the [facility] and check in at the registration area. ?  ?Wear comfortable clothing and clothing appropriate for easy access to any Portacath or PICC line.  ? ?We strive to give you quality time with your provider. You may need to reschedule your appointment if you arrive late (15 or more minutes).  Arriving late affects you and other patients whose appointments are after yours.  Also, if you miss three or more appointments without notifying the office, you may be dismissed from the clinic at the provider?s discretion.    ?  ?For prescription refill requests, have your pharmacy contact our office and allow 72 hours for refills to be completed.   ? ?Today you received the following chemotherapy and/or immunotherapy agents: Gemzar, Cisplatin    ?  ?To help prevent nausea and vomiting after your treatment, we encourage you to take your nausea medication as directed. ? ?BELOW ARE SYMPTOMS THAT SHOULD BE REPORTED IMMEDIATELY: ?*FEVER GREATER THAN 100.4 F (38 ?C) OR HIGHER ?*CHILLS OR SWEATING ?*NAUSEA AND VOMITING THAT IS NOT CONTROLLED WITH YOUR NAUSEA MEDICATION ?*UNUSUAL SHORTNESS OF BREATH ?*UNUSUAL BRUISING OR BLEEDING ?*URINARY PROBLEMS (pain or burning when urinating, or frequent urination) ?*BOWEL PROBLEMS (unusual diarrhea, constipation, pain near the anus) ?TENDERNESS IN MOUTH AND THROAT WITH OR WITHOUT PRESENCE OF ULCERS (sore throat, sores in mouth, or a toothache) ?UNUSUAL RASH, SWELLING OR PAIN  ?UNUSUAL VAGINAL DISCHARGE OR ITCHING  ? ?Items with * indicate a potential emergency and should be followed up as soon as possible or go to the Emergency Department if any problems should occur. ? ?Please show the CHEMOTHERAPY ALERT CARD or IMMUNOTHERAPY ALERT CARD at  check-in to the Emergency Department and triage nurse. ? ?Should you have questions after your visit or need to cancel or reschedule your appointment, please contact [facility]  Dept: [phone]  and follow the prompts.  Office hours are 8:00 a.m. to 4:30 p.m. Monday - Friday. Please note that voicemails left after 4:00 p.m. may not be returned until the following business day.  We are closed weekends and major holidays. You have access to a nurse at all times for urgent questions. Please call the main number to the clinic Dept: [phone] and follow the prompts. ? ? ?For any non-urgent questions, you may also contact your provider using MyChart. We now offer e-Visits for anyone 72 and older to request care online for non-urgent symptoms. For details visit [email or web address]. ?  ?Also download the MyChart app! Go to the app store, search "MyChart", open the app, select [facility], and log in with your MyChart username and password. ? ?Due to Covid, a mask is required upon entering the hospital/clinic. If you do not have a mask, one will be given to you upon arrival. For doctor visits, patients may have 1 support person aged 41 or older with them. For treatment visits, patients cannot have anyone with them due to current Covid guidelines and our immunocompromised population.  ? ?

## 2022-02-04 NOTE — Progress Notes (Signed)
Hematology and Oncology Follow Up Visit ? ?Jonathan Ray ?[id number] ?August 16, 1956 66 y.o. ?02/04/2022 8:19 AM ?[name], DOHerrick, Viona Gilmore, MD  ? ?Principle Diagnosis: 66 year old bladder cancer diagnosed in 2021.  He developed relapsed disease with lymphadenopathy in March 2023. ? ? ?Prior Therapy: ?He is status post partial cystectomy and found to have T3a poorly differentiated carcinoma after a surgery that was completed on April 08, 2020.   ? 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  He was treated with BCG for 6 weeks completed in August 2022.   ? ?MRI obtained on December 12, 2021 which showed a severe thickening and hyperenhancement of the anterior dome of the bladder at least measuring 2.1 cm thickness with suspicious findings of recurrent or residual bladder malignancy ? ? ?Current therapy: Gemcitabine and cisplatin chemotherapy started on January 14, 2022.  He is here for day 1 cycle 2 of therapy. ? ?Interim History: [name] returns today for a follow-up visit.  Since the last visit, he completed the first cycle of chemotherapy without any major complications.  He denies any nausea, vomiting or abdominal pain.  He denies any worsening neuropathy or fatigue.  He did have an episode of constipation and was seen in the emergency department on January 16, 2022 and has resolved.  Performance status quality of life remained reasonable.  He is eating well and gaining more weight. ? ? ? ? ?Medications: I have reviewed the patient's current medications.  ?Current Outpatient Medications  ?Medication Sig Dispense Refill  ? diazepam (VALIUM) 10 MG tablet Take 20 mg by mouth daily.    ? glycerin adult 2 g suppository Place 1 suppository rectally as needed for constipation. 12 suppository 0  ? ibuprofen (ADVIL) 200 MG tablet Take 200 mg by mouth every 6 (six) hours as needed.    ? lidocaine-prilocaine (EMLA) cream Apply 1 application. topically as needed. 30 g 0  ?  naproxen sodium (ALEVE) 220 MG tablet Take 220 mg by mouth.    ? polyethylene glycol powder (GLYCOLAX/MIRALAX) 17 GM/SCOOP powder Take 17 g by mouth daily. 255 g 0  ? prochlorperazine (COMPAZINE) 10 MG tablet Take 1 tablet (10 mg total) by mouth every 6 (six) hours as needed for nausea or vomiting. 30 tablet 0  ? ?No current facility-administered medications for this visit.  ? ?Facility-Administered Medications Ordered in Other Visits  ?Medication Dose Route Frequency Provider Last Rate Last Admin  ? gemcitabine (GEMZAR) chemo syringe for bladder instillation 2,000 mg  2,000 mg Bladder Instillation Once [name], MD      ? ? ? ?Allergies:  ?Allergies  ?Allergen Reactions  ? Penicillins Other (See Comments)  ?  Does not remember what happens-childhood allergy ?Has patient had a PCN reaction causing immediate rash, facial/tongue/throat swelling, SOB or lightheadedness with hypotension: Unknown ?Has patient had a PCN reaction causing severe rash involving mucus membranes or skin necrosis: Unknown ?Has patient had a PCN reaction that required hospitalization Unknown ?Has patient had a PCN reaction occurring within the last 10 years: No ?If all of the above answers are "NO", then may proceed with Cephalosporin use  ? ? ?Physical Exam: ?Blood pressure 134/70, pulse 71, temperature 98 ?F (36.7 ?C), temperature source Temporal, resp. rate 15, height '6\' 1"'$  (1.854 m), weight 195 lb 4.8 oz (88.6 kg), SpO2 100 %. ?ECOG: 1 ?General appearance: alert and cooperative appeared without distress. ?Head: Normocephalic, without obvious abnormality ?Oropharynx: No oral  thrush or ulcers. ?Eyes: No scleral icterus.  Pupils are equal and round reactive to light. ?Lymph nodes: Cervical, supraclavicular, and axillary nodes normal. ?Heart:regular rate and rhythm, S1, S2 normal, no murmur, click, rub or gallop ?Lung:chest clear, no wheezing, rales, normal symmetric air entry ?Abdomin: soft, non-tender, without masses or  organomegaly. ?Neurological: No motor, sensory deficits.  Intact deep tendon reflexes. ?Skin: No rashes or lesions.  No ecchymosis or petechiae. ?Musculoskeletal: No joint deformity or effusion. ?Psychiatric: Mood and affect are appropriate. ? ? ? ?Lab Results: ?Lab Results  ?Component Value Date  ? WBC 3.3 (L) 01/21/2022  ? HGB 11.4 (L) 01/21/2022  ? HCT 33.3 (L) 01/21/2022  ? MCV 85.6 01/21/2022  ? PLT 125 (L) 01/21/2022  ? ?  Chemistry   ?   ?Component Value Date/Time  ? NA 132 (L) 01/21/2022 1030  ? NA 143 02/10/2017 1111  ? K 3.8 01/21/2022 1030  ? CL 98 01/21/2022 1030  ? CO2 29 01/21/2022 1030  ? BUN 7 (L) 01/21/2022 1030  ? BUN 8 02/10/2017 1111  ? CREATININE 0.71 01/21/2022 1030  ?    ?Component Value Date/Time  ? CALCIUM 8.8 (L) 01/21/2022 1030  ? ALKPHOS 59 01/21/2022 1030  ? AST 14 (L) 01/21/2022 1030  ? ALT 12 01/21/2022 1030  ? BILITOT 0.7 01/21/2022 1030  ?  ? ? ? ? ? ? ?Impression and Plan: ? ?66 year old with: ? ?1.  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  He was initially diagnosed in 2021 with localized disease. ?  ?Risks and benefits of proceeding with treatment were discussed at this time.  Complications: Myelosuppression, neutropenia possible sepsis were reiterated.  The plan is to proceed with 3-4 cycles of therapy and then repeat staging scans.  Based on these findings he might undergo salvage cystectomy.  He is agreeable to proceed. ?  ?  ?2.  IV access: Port-A-Cath inserted without any complications. ? ? ?3.  Antiemetics: Compazine is available to him without any nausea or vomiting reported. ?  ?4.  Renal function surveillance: Creatinine clearance remains within normal range after platinum based therapy.  We will continue to monitor. ? ?5.  Neutropenia: Related to systemic chemotherapy.  We will adjust the gemcitabine dosing for better attempt to keep him on schedule. ?  ?5.  Goals of care: Aggressive measures are warranted for curative intent at this  time. ?  ?6.  Follow-up: He will return in 1 week to complete cycle 2 and in 3 weeks for the beginning of cycle 3. ?  ?  ?30  minutes were spent on this encounter.  The time was dedicated to updating his disease status, discussing complications related to cancer, cancer therapy and future plan of care discussion. ?  ?  ? ? ?Zola Button, MD ?4/27/20238:19 AM ? ?

## 2022-02-04 NOTE — Addendum Note (Signed)
Addended by: Sharlynn Oliphant A on: 02/04/2022 09:25 AM ? ? Modules accepted: Orders ? ?

## 2022-02-04 NOTE — Progress Notes (Signed)
Per Dr. Alen Blew ok to begin treatment with magnesium results pending. Lab added on after appointment time. ?

## 2022-02-11 ENCOUNTER — Inpatient Hospital Stay: Payer: 59 | Attending: Oncology

## 2022-02-11 ENCOUNTER — Inpatient Hospital Stay: Payer: 59

## 2022-02-11 ENCOUNTER — Other Ambulatory Visit: Payer: Self-pay

## 2022-02-11 VITALS — BP 152/81 | HR 79 | Temp 98.5°F | Resp 16 | Wt 186.0 lb

## 2022-02-11 DIAGNOSIS — R69 Illness, unspecified: Secondary | ICD-10-CM | POA: Diagnosis not present

## 2022-02-11 DIAGNOSIS — C678 Malignant neoplasm of overlapping sites of bladder: Secondary | ICD-10-CM

## 2022-02-11 DIAGNOSIS — T451X5D Adverse effect of antineoplastic and immunosuppressive drugs, subsequent encounter: Secondary | ICD-10-CM | POA: Insufficient documentation

## 2022-02-11 DIAGNOSIS — Z5111 Encounter for antineoplastic chemotherapy: Secondary | ICD-10-CM | POA: Insufficient documentation

## 2022-02-11 DIAGNOSIS — F17210 Nicotine dependence, cigarettes, uncomplicated: Secondary | ICD-10-CM | POA: Insufficient documentation

## 2022-02-11 DIAGNOSIS — C679 Malignant neoplasm of bladder, unspecified: Secondary | ICD-10-CM

## 2022-02-11 DIAGNOSIS — D701 Agranulocytosis secondary to cancer chemotherapy: Secondary | ICD-10-CM | POA: Insufficient documentation

## 2022-02-11 LAB — CMP (CANCER CENTER ONLY)
ALT: 15 U/L (ref 0–44)
AST: 15 U/L (ref 15–41)
Albumin: 3.9 g/dL (ref 3.5–5.0)
Alkaline Phosphatase: 72 U/L (ref 38–126)
Anion gap: 7 (ref 5–15)
BUN: 7 mg/dL — ABNORMAL LOW (ref 8–23)
CO2: 30 mmol/L (ref 22–32)
Calcium: 9.3 mg/dL (ref 8.9–10.3)
Chloride: 98 mmol/L (ref 98–111)
Creatinine: 0.79 mg/dL (ref 0.61–1.24)
GFR, Estimated: >60 mL/min (ref >60)
Glucose, Bld: 137 mg/dL — ABNORMAL HIGH (ref 70–99)
Potassium: 3.6 mmol/L (ref 3.5–5.1)
Sodium: 135 mmol/L (ref 135–145)
Total Bilirubin: 0.3 mg/dL (ref 0.3–1.2)
Total Protein: 6.7 g/dL (ref 6.5–8.1)

## 2022-02-11 LAB — CBC WITH DIFFERENTIAL (CANCER CENTER ONLY)
Abs Immature Granulocytes: 0.03 10*3/uL (ref 0.00–0.07)
Basophils Absolute: 0 10*3/uL (ref 0.0–0.1)
Basophils Relative: 1 %
Eosinophils Absolute: 0 10*3/uL (ref 0.0–0.5)
Eosinophils Relative: 0 %
HCT: 31.4 % — ABNORMAL LOW (ref 39.0–52.0)
Hemoglobin: 11.1 g/dL — ABNORMAL LOW (ref 13.0–17.0)
Immature Granulocytes: 1 %
Lymphocytes Relative: 39 %
Lymphs Abs: 1.4 10*3/uL (ref 0.7–4.0)
MCH: 30.2 pg (ref 26.0–34.0)
MCHC: 35.4 g/dL (ref 30.0–36.0)
MCV: 85.6 fL (ref 80.0–100.0)
Monocytes Absolute: 0.4 10*3/uL (ref 0.1–1.0)
Monocytes Relative: 11 %
Neutro Abs: 1.7 10*3/uL (ref 1.7–7.7)
Neutrophils Relative %: 48 %
Platelet Count: 403 10*3/uL — ABNORMAL HIGH (ref 150–400)
RBC: 3.67 MIL/uL — ABNORMAL LOW (ref 4.22–5.81)
RDW: 12.3 % (ref 11.5–15.5)
WBC Count: 3.6 10*3/uL — ABNORMAL LOW (ref 4.0–10.5)
nRBC: 0 % (ref 0.0–0.2)

## 2022-02-11 MED ORDER — HEPARIN SOD (PORK) LOCK FLUSH 100 UNIT/ML IV SOLN
500.0000 [IU] | Freq: Once | INTRAVENOUS | Status: AC | PRN
  Administered 2022-02-11: 500 [IU]

## 2022-02-11 MED ORDER — SODIUM CHLORIDE 0.9 % IV SOLN
800.0000 mg/m2 | Freq: Once | INTRAVENOUS | Status: AC
  Administered 2022-02-11: 1710 mg via INTRAVENOUS
  Filled 2022-02-11: qty 44.97

## 2022-02-11 MED ORDER — SODIUM CHLORIDE 0.9 % IV SOLN: Freq: Once | INTRAVENOUS | Status: DC

## 2022-02-11 MED ORDER — SODIUM CHLORIDE 0.9% FLUSH
10.0000 mL | INTRAVENOUS | Status: DC | PRN
  Administered 2022-02-11 (×2): 10 mL

## 2022-02-11 MED ORDER — PROCHLORPERAZINE MALEATE 10 MG PO TABS
10.0000 mg | ORAL_TABLET | Freq: Once | ORAL | Status: AC
  Administered 2022-02-11: 10 mg via ORAL
  Filled 2022-02-11: qty 1

## 2022-02-11 MED ORDER — SODIUM CHLORIDE 0.9 % IV SOLN: Freq: Once | INTRAVENOUS | Status: AC

## 2022-02-11 NOTE — Patient Instructions (Signed)
[facility]  Discharge Instructions: ?Thank you for choosing [facility] to provide your oncology and hematology care.  ? ?If you have a lab appointment with the [facility], please go directly to the [facility] and check in at the registration area. ?  ?Wear comfortable clothing and clothing appropriate for easy access to any Portacath or PICC line.  ? ?We strive to give you quality time with your provider. You may need to reschedule your appointment if you arrive late (15 or more minutes).  Arriving late affects you and other patients whose appointments are after yours.  Also, if you miss three or more appointments without notifying the office, you may be dismissed from the clinic at the provider?s discretion.    ?  ?For prescription refill requests, have your pharmacy contact our office and allow 72 hours for refills to be completed.   ? ?Today you received the following chemotherapy and/or immunotherapy agents gemzar    ?  ?To help prevent nausea and vomiting after your treatment, we encourage you to take your nausea medication as directed. ? ?BELOW ARE SYMPTOMS THAT SHOULD BE REPORTED IMMEDIATELY: ?*FEVER GREATER THAN 100.4 F (38 ?C) OR HIGHER ?*CHILLS OR SWEATING ?*NAUSEA AND VOMITING THAT IS NOT CONTROLLED WITH YOUR NAUSEA MEDICATION ?*UNUSUAL SHORTNESS OF BREATH ?*UNUSUAL BRUISING OR BLEEDING ?*URINARY PROBLEMS (pain or burning when urinating, or frequent urination) ?*BOWEL PROBLEMS (unusual diarrhea, constipation, pain near the anus) ?TENDERNESS IN MOUTH AND THROAT WITH OR WITHOUT PRESENCE OF ULCERS (sore throat, sores in mouth, or a toothache) ?UNUSUAL RASH, SWELLING OR PAIN  ?UNUSUAL VAGINAL DISCHARGE OR ITCHING  ? ?Items with * indicate a potential emergency and should be followed up as soon as possible or go to the Emergency Department if any problems should occur. ? ?Please show the CHEMOTHERAPY ALERT CARD or IMMUNOTHERAPY ALERT CARD at check-in to the  Emergency Department and triage nurse. ? ?Should you have questions after your visit or need to cancel or reschedule your appointment, please contact [facility]  Dept: [phone]  and follow the prompts.  Office hours are 8:00 a.m. to 4:30 p.m. Monday - Friday. Please note that voicemails left after 4:00 p.m. may not be returned until the following business day.  We are closed weekends and major holidays. You have access to a nurse at all times for urgent questions. Please call the main number to the clinic Dept: [phone] and follow the prompts. ? ? ?For any non-urgent questions, you may also contact your provider using MyChart. We now offer e-Visits for anyone 22 and older to request care online for non-urgent symptoms. For details visit [email or web address]. ?  ?Also download the MyChart app! Go to the app store, search "MyChart", open the app, select [facility], and log in with your MyChart username and password. ? ?Due to Covid, a mask is required upon entering the hospital/clinic. If you do not have a mask, one will be given to you upon arrival. For doctor visits, patients may have 1 support person aged 30 or older with them. For treatment visits, patients cannot have anyone with them due to current Covid guidelines and our immunocompromised population.  ? ?Gemcitabine injection ?What is this medication? ?GEMCITABINE (jem SYE ta been) is a chemotherapy drug. This medicine is used to treat many types of cancer like breast cancer, lung cancer, pancreatic cancer, and ovarian cancer. ?This medicine may be used for other purposes; ask your health care provider or  pharmacist if you have questions. ?COMMON BRAND NAME(S): Gemzar, Infugem ?What should I tell my care team before I take this medication? ?They need to know if you have any of these conditions: ?blood disorders ?infection ?kidney disease ?liver disease ?lung or breathing disease, like asthma ?recent or ongoing  radiation therapy ?an unusual or allergic reaction to gemcitabine, other chemotherapy, other medicines, foods, dyes, or preservatives ?pregnant or trying to get pregnant ?breast-feeding ?How should I use this medication? ?This drug is given as an infusion into a vein. It is administered in a hospital or clinic by a specially trained health care professional. ?Talk to your pediatrician regarding the use of this medicine in children. Special care may be needed. ?Overdosage: If you think you have taken too much of this medicine contact a poison control center or emergency room at once. ?NOTE: This medicine is only for you. Do not share this medicine with others. ?What if I miss a dose? ?It is important not to miss your dose. Call your doctor or health care professional if you are unable to keep an appointment. ?What may interact with this medication? ?medicines to increase blood counts like filgrastim, pegfilgrastim, sargramostim ?some other chemotherapy drugs like cisplatin ?vaccines ?Talk to your doctor or health care professional before taking any of these medicines: ?acetaminophen ?aspirin ?ibuprofen ?ketoprofen ?naproxen ?This list may not describe all possible interactions. Give your health care provider a list of all the medicines, herbs, non-prescription drugs, or dietary supplements you use. Also tell them if you smoke, drink alcohol, or use illegal drugs. Some items may interact with your medicine. ?What should I watch for while using this medication? ?Visit your doctor for checks on your progress. This drug may make you feel generally unwell. This is not uncommon, as chemotherapy can affect healthy cells as well as cancer cells. Report any side effects. Continue your course of treatment even though you feel ill unless your doctor tells you to stop. ?In some cases, you may be given additional medicines to help with side effects. Follow all directions for their use. ?Call your doctor or health care  professional for advice if you get a fever, chills or sore throat, or other symptoms of a cold or flu. Do not treat yourself. This drug decreases your body's ability to fight infections. Try to avoid being around people who are sick. ?This medicine may increase your risk to bruise or bleed. Call your doctor or health care professional if you notice any unusual bleeding. ?Be careful brushing and flossing your teeth or using a toothpick because you may get an infection or bleed more easily. If you have any dental work done, tell your dentist you are receiving this medicine. ?Avoid taking products that contain aspirin, acetaminophen, ibuprofen, naproxen, or ketoprofen unless instructed by your doctor. These medicines may hide a fever. ?Do not become pregnant while taking this medicine or for 6 months after stopping it. Women should inform their doctor if they wish to become pregnant or think they might be pregnant. Men should not father a child while taking this medicine and for 3 months after stopping it. There is a potential for serious side effects to an unborn child. Talk to your health care professional or pharmacist for more information. Do not breast-feed an infant while taking this medicine or for at least 1 week after stopping it. ?Men should inform their doctors if they wish to father a child. This medicine may lower sperm counts. Talk with your doctor or health care  professional if you are concerned about your fertility. ?What side effects may I notice from receiving this medication? ?Side effects that you should report to your doctor or health care professional as soon as possible: ?allergic reactions like skin rash, itching or hives, swelling of the face, lips, or tongue ?breathing problems ?pain, redness, or irritation at site where injected ?signs and symptoms of a dangerous change in heartbeat or heart rhythm like chest pain; dizziness; fast or irregular heartbeat; palpitations; feeling faint or  lightheaded, falls; breathing problems ?signs of decreased platelets or bleeding - bruising, pinpoint red spots on the skin, black, tarry stools, blood in the urine ?signs of decreased red blood cells - unusually weak or tire

## 2022-02-24 ENCOUNTER — Telehealth: Payer: Self-pay | Admitting: *Deleted

## 2022-02-24 ENCOUNTER — Emergency Department (HOSPITAL_COMMUNITY)
Admission: EM | Admit: 2022-02-24 | Discharge: 2022-02-24 | Disposition: A | Discharge status: Home / Self Care | Payer: 59 | Attending: Emergency Medicine | Admitting: Emergency Medicine

## 2022-02-24 ENCOUNTER — Emergency Department (HOSPITAL_COMMUNITY): Payer: 59

## 2022-02-24 ENCOUNTER — Telehealth: Payer: Self-pay | Admitting: Oncology

## 2022-02-24 ENCOUNTER — Encounter (HOSPITAL_COMMUNITY): Payer: Self-pay

## 2022-02-24 DIAGNOSIS — I700 Atherosclerosis of aorta: Secondary | ICD-10-CM | POA: Diagnosis not present

## 2022-02-24 DIAGNOSIS — J449 Chronic obstructive pulmonary disease, unspecified: Secondary | ICD-10-CM | POA: Diagnosis not present

## 2022-02-24 DIAGNOSIS — C679 Malignant neoplasm of bladder, unspecified: Secondary | ICD-10-CM | POA: Insufficient documentation

## 2022-02-24 DIAGNOSIS — N400 Enlarged prostate without lower urinary tract symptoms: Secondary | ICD-10-CM | POA: Diagnosis not present

## 2022-02-24 DIAGNOSIS — Z8547 Personal history of malignant neoplasm of testis: Secondary | ICD-10-CM | POA: Insufficient documentation

## 2022-02-24 DIAGNOSIS — D649 Anemia, unspecified: Secondary | ICD-10-CM | POA: Diagnosis not present

## 2022-02-24 DIAGNOSIS — R109 Unspecified abdominal pain: Secondary | ICD-10-CM | POA: Diagnosis not present

## 2022-02-24 LAB — COMPREHENSIVE METABOLIC PANEL
ALT: 10 U/L (ref 0–44)
AST: 14 U/L — ABNORMAL LOW (ref 15–41)
Albumin: 3.5 g/dL (ref 3.5–5.0)
Alkaline Phosphatase: 61 U/L (ref 38–126)
Anion gap: 6 (ref 5–15)
BUN: 8 mg/dL (ref 8–23)
CO2: 26 mmol/L (ref 22–32)
Calcium: 8.6 mg/dL — ABNORMAL LOW (ref 8.9–10.3)
Chloride: 106 mmol/L (ref 98–111)
Creatinine, Ser: 0.73 mg/dL (ref 0.61–1.24)
GFR, Estimated: >60 mL/min (ref >60)
Glucose, Bld: 98 mg/dL (ref 70–99)
Potassium: 3.8 mmol/L (ref 3.5–5.1)
Sodium: 138 mmol/L (ref 135–145)
Total Bilirubin: 0.5 mg/dL (ref 0.3–1.2)
Total Protein: 6.3 g/dL — ABNORMAL LOW (ref 6.5–8.1)

## 2022-02-24 LAB — URINALYSIS, ROUTINE W REFLEX MICROSCOPIC
Bacteria, UA: NONE SEEN
Bilirubin Urine: NEGATIVE
Glucose, UA: NEGATIVE mg/dL
Ketones, ur: NEGATIVE mg/dL
Leukocytes,Ua: NEGATIVE
Nitrite: NEGATIVE
Protein, ur: NEGATIVE mg/dL
Specific Gravity, Urine: 1.002 — ABNORMAL LOW (ref 1.005–1.030)
pH: 6 (ref 5.0–8.0)

## 2022-02-24 LAB — CBC WITH DIFFERENTIAL/PLATELET
Abs Immature Granulocytes: 0.02 10*3/uL (ref 0.00–0.07)
Basophils Absolute: 0 10*3/uL (ref 0.0–0.1)
Basophils Relative: 0 %
Eosinophils Absolute: 0 10*3/uL (ref 0.0–0.5)
Eosinophils Relative: 1 %
HCT: 31.3 % — ABNORMAL LOW (ref 39.0–52.0)
Hemoglobin: 10.8 g/dL — ABNORMAL LOW (ref 13.0–17.0)
Immature Granulocytes: 1 %
Lymphocytes Relative: 26 %
Lymphs Abs: 0.8 10*3/uL (ref 0.7–4.0)
MCH: 31.1 pg (ref 26.0–34.0)
MCHC: 34.5 g/dL (ref 30.0–36.0)
MCV: 90.2 fL (ref 80.0–100.0)
Monocytes Absolute: 0.6 10*3/uL (ref 0.1–1.0)
Monocytes Relative: 19 %
Neutro Abs: 1.6 10*3/uL — ABNORMAL LOW (ref 1.7–7.7)
Neutrophils Relative %: 53 %
Platelets: 200 10*3/uL (ref 150–400)
RBC: 3.47 MIL/uL — ABNORMAL LOW (ref 4.22–5.81)
RDW: 14.8 % (ref 11.5–15.5)
WBC: 3 10*3/uL — ABNORMAL LOW (ref 4.0–10.5)
nRBC: 0 % (ref 0.0–0.2)

## 2022-02-24 LAB — LIPASE, BLOOD: Lipase: 27 U/L (ref 11–51)

## 2022-02-24 MED ORDER — ONDANSETRON HCL 4 MG/2ML IJ SOLN
4.0000 mg | Freq: Once | INTRAMUSCULAR | Status: AC
  Administered 2022-02-24: 4 mg via INTRAVENOUS
  Filled 2022-02-24: qty 2

## 2022-02-24 MED ORDER — SODIUM CHLORIDE 0.9 % IV BOLUS
1000.0000 mL | Freq: Once | INTRAVENOUS | Status: AC
  Administered 2022-02-24: 1000 mL via INTRAVENOUS

## 2022-02-24 MED ORDER — OXYCODONE-ACETAMINOPHEN 5-325 MG PO TABS: 1.0000 | ORAL_TABLET | Freq: Four times a day (QID) | ORAL | 0 refills | Status: DC | PRN

## 2022-02-24 MED ORDER — SODIUM CHLORIDE 0.9 % IV SOLN: INTRAVENOUS | Status: DC

## 2022-02-24 MED ORDER — IOHEXOL 300 MG/ML  SOLN
100.0000 mL | Freq: Once | INTRAMUSCULAR | Status: AC | PRN
  Administered 2022-02-24: 100 mL via INTRAVENOUS

## 2022-02-24 MED ORDER — HYDROMORPHONE HCL 1 MG/ML IJ SOLN
0.5000 mg | Freq: Once | INTRAMUSCULAR | Status: AC
  Administered 2022-02-24: 0.5 mg via INTRAVENOUS
  Filled 2022-02-24: qty 1

## 2022-02-24 MED FILL — Fosaprepitant Dimeglumine For IV Infusion 150 MG (Base Eq): INTRAVENOUS | Qty: 5 | Status: AC

## 2022-02-24 MED FILL — Dexamethasone Sodium Phosphate Inj 100 MG/10ML: INTRAMUSCULAR | Qty: 1 | Status: AC

## 2022-02-24 NOTE — Telephone Encounter (Signed)
PC to patient, informed him that Dr. Alen Blew recommends he be evaluated in the ED, patient & his wife verbalize understanding. ?

## 2022-02-24 NOTE — Discharge Instructions (Addendum)
Take the medications as needed for pain.  Follow-up with your oncology doctor to be rechecked. ?

## 2022-02-24 NOTE — Telephone Encounter (Signed)
-----   Message from [name], MD sent at 02/24/2022  9:47 AM EDT ----- ?Regarding: RE: abdominal pain ?No ?----- Message ----- ?From: Rolene Course, RN ?Sent: 02/24/2022   9:40 AM EDT ?To: [name], MD ?Subject: RE: abdominal pain                            ? ?OK I will let him know.  Do you want to prescribe him anything for pain? ? ?----- Message ----- ?From: [name], MD ?Sent: 02/24/2022   9:27 AM EDT ?To: Rolene Course, RN ?Subject: RE: abdominal pain                            ? ?ER if his pain continues.  ?----- Message ----- ?From: Rolene Course, RN ?Sent: 02/24/2022   9:08 AM EDT ?To: [name], MD ?Subject: abdominal pain                                ? ?Jonathan Ray called this morning, he is having intense abdominal pain x 5 days, R side & radiates to the center. He said the pain usually starts in the evening & continues at night, he hasn't been able to sleep much & he only has aleve or diazepam to take which isn't helping the pain.  He denies any nausea, vomiting, diarrhea, has had some slight constipation but doesn't go more than two days without a BM.  He has an infusion scheduled tomorrow but says he doesn't want to go through that with this pain.  Please advise. ? ?Thanks, ?Bethena Roys ? ? ? ? ?

## 2022-02-24 NOTE — Telephone Encounter (Signed)
Called patient regarding upcoming appointment, left a voicemail. 

## 2022-02-24 NOTE — ED Provider Notes (Signed)
?[facility] DEPT ?Provider Note ? ? ?CSN: [id number] ?Arrival date & time: 02/24/22  1116 ? ?  ? ?History ? ?Chief Complaint  ?Patient presents with  ? Abdominal Pain  ? ? ?Jonathan Ray is a 66 y.o. male. ? ? ?Abdominal Pain ?Associated symptoms: no fever   ?Patient is undergoing active treatments for his bladder cancer.  Patient has a history of Testicular cancer, bladder cancer, palpitations, anemia, COPD.  Patient is undergoing treatments for his chemotherapy last infusion was on May 4.  Patient received Gemzar at that time.  Patient states in the last few days he started having pain in his right abdomen.  Pain was intense and more severe.  He has not had any nausea vomiting.  No diarrhea.  He did have some dysuria recently.  He has not noticed any fevers or chills.  He is also had some constipation but not severe and he used some laxatives with good relief but the pain seems to persist. ?Home Medications ?Prior to Admission medications   ?Medication Sig Start Date End Date Taking? Authorizing Provider  ?diazepam (VALIUM) 10 MG tablet Take 10 mg by mouth daily. 12/01/21  Yes [provider]  ?lidocaine-prilocaine (EMLA) cream Apply 1 application. topically as needed. 01/01/22  Yes [name], MD  ?oxyCODONE-acetaminophen (PERCOCET/ROXICET) 5-325 MG tablet Take 1 tablet by mouth every 6 (six) hours as needed for severe pain. 02/24/22  Yes Dorie Rank, MD  ?tamsulosin (FLOMAX) 0.4 MG CAPS capsule Take 0.4 mg by mouth daily. 02/22/22  Yes [provider]  ?meloxicam (MOBIC) 15 MG tablet Take 15 mg by mouth daily. ?Patient not taking: Reported on 02/24/2022 02/08/22   [provider]  ?   ? ?Allergies    ?Penicillins   ? ?Review of Systems   ?Review of Systems  ?Constitutional:  Negative for fever.  ?Gastrointestinal:  Positive for abdominal pain.  ? ?Physical Exam ?Updated Vital Signs ?BP 121/68   Pulse 61   Temp 97.9 ?F (36.6 ?C) (Oral)   Resp 17   Ht 1.854  m ('6\' 1"'$ )   Wt 84.1 kg   SpO2 95%   BMI 24.47 kg/m?  ?Physical Exam ?Vitals and nursing note reviewed.  ?Constitutional:   ?   General: He is not in acute distress. ?   Appearance: He is well-developed.  ?HENT:  ?   Head: Normocephalic and atraumatic.  ?   Right Ear: External ear normal.  ?   Left Ear: External ear normal.  ?Eyes:  ?   General: No scleral icterus.    ?   Right eye: No discharge.     ?   Left eye: No discharge.  ?   Conjunctiva/sclera: Conjunctivae normal.  ?Neck:  ?   Trachea: No tracheal deviation.  ?Cardiovascular:  ?   Rate and Rhythm: Normal rate and regular rhythm.  ?Pulmonary:  ?   Effort: Pulmonary effort is normal. No respiratory distress.  ?   Breath sounds: Normal breath sounds. No stridor. No wheezing or rales.  ?Abdominal:  ?   General: Bowel sounds are normal. There is no distension.  ?   Palpations: Abdomen is soft.  ?   Tenderness: There is abdominal tenderness in the right lower quadrant. There is no guarding or rebound.  ?   Hernia: No hernia is present.  ?Musculoskeletal:     ?   General: No tenderness or deformity.  ?   Cervical back: Neck supple.  ?Skin: ?  General: Skin is warm and dry.  ?   Findings: No rash.  ?Neurological:  ?   General: No focal deficit present.  ?   Mental Status: He is alert.  ?   Cranial Nerves: No cranial nerve deficit (no facial droop, extraocular movements intact, no slurred speech).  ?   Sensory: No sensory deficit.  ?   Motor: No abnormal muscle tone or seizure activity.  ?   Coordination: Coordination normal.  ?Psychiatric:     ?   Mood and Affect: Mood normal.  ? ? ?ED Results / Procedures / Treatments   ?Labs ?(all labs ordered are listed, but only abnormal results are displayed) ?Labs Reviewed  ?COMPREHENSIVE METABOLIC PANEL - Abnormal; Notable for the following components:  ?    Result Value  ? Calcium 8.6 (*)   ? Total Protein 6.3 (*)   ? AST 14 (*)   ? All other components within normal limits  ?CBC WITH DIFFERENTIAL/PLATELET - Abnormal;  Notable for the following components:  ? WBC 3.0 (*)   ? RBC 3.47 (*)   ? Hemoglobin 10.8 (*)   ? HCT 31.3 (*)   ? Neutro Abs 1.6 (*)   ? All other components within normal limits  ?URINALYSIS, ROUTINE W REFLEX MICROSCOPIC - Abnormal; Notable for the following components:  ? Color, Urine COLORLESS (*)   ? Specific Gravity, Urine 1.002 (*)   ? Hgb urine dipstick SMALL (*)   ? All other components within normal limits  ?LIPASE, BLOOD  ? ? ?EKG ?None ? ?Radiology ?CT ABDOMEN PELVIS W CONTRAST ? ?Result Date: 02/24/2022 ?CLINICAL DATA:  Metastatic bladder cancer, current chemotherapy with next treatment planned for tomorrow. Intermittent epigastric and right upper quadrant abdominal pain over the last 3 days. * Tracking Code: BO * EXAM: CT ABDOMEN AND PELVIS WITH CONTRAST TECHNIQUE: Multidetector CT imaging of the abdomen and pelvis was performed using the standard protocol following bolus administration of intravenous contrast. RADIATION DOSE REDUCTION: This exam was performed according to the departmental dose-optimization program which includes automated exposure control, adjustment of the mA and/or kV according to patient size and/or use of iterative reconstruction technique. CONTRAST:  114m OMNIPAQUE IOHEXOL 300 MG/ML  SOLN COMPARISON:  PET-CT 01/15/2022 and MRI abdomen 12/12/2021 FINDINGS: Lower chest: Mild atelectasis in the posterior basal segment right lower lobe. Hepatobiliary: Numerous small hypodense lesions scattered in the liver as on prior exams, generally favoring cysts. Many of these are technically too small to characterize. Gallbladder unremarkable. No biliary dilatation. Pancreas: Possible dilated ventral pancreatic duct with a 1.4 by 0.8 cm cystic lesion in the pancreatic head on image 27 series 2 likely in continuity with the ventral pancreatic duct. No dorsal pancreatic duct dilatation is identified. Spleen: Unremarkable Adrenals/Urinary Tract: The adrenal glands and kidneys appear normal. 6.4 by  2.1 by 4.9 cm mass along the anterior dome of the urinary bladder with possible extravesical extension and difficulty separating the mass from adjacent loops of bowel for example on image 79 series 6. Small right posterior bladder diverticulum. Stomach/Bowel: As noted above, there was difficulty separating loops of small bowel from the bladder tumor. No dilated bowel. Vascular/Lymphatic: Atherosclerosis is present, including aortoiliac atherosclerotic disease. Previously hypermetabolic pelvic lymph nodes are present, index node in the right external iliac chain 1.0 cm in short axis on image 63 series 2, formerly 1.1 cm by my measurements. A formerly hypermetabolic left iliac node near the bifurcation measures 1.1 cm in short axis on image 57 series 2,  formerly 1.0 cm. Overall the pelvic adenopathy is roughly stable. Reproductive: Mild prostatomegaly. Left orchectomy with high position of the right testicle along the spermatic cord. Other: Substantial nodular tumor deposition along the omentum compatible with peritoneal tumor spread, mildly worsened compared to prior. A left upper quadrant omental tumor deposit measures 3.3 by 2.8 cm on image 26 series 2, previously about 2.6 by 1 point 8 cm. Increased clustered tumor nodularity in the central omentum as on image 34 series 2. A right upper quadrant tumor deposit near the gallbladder measures 1.7 by 1.1 cm on image 33 series 2, formerly 0.9 by 0.6 cm. Trace free pelvic fluid. Musculoskeletal: No findings of osseous metastatic disease. IMPRESSION: 1. Persistent large tumor in the anterior roof of the urinary bladder, with indistinct tissue planes between this mass and adjacent loops of small bowel. Increased omental tumor burden compared to 01/15/2022. Trace free pelvic fluid. Stable mild pelvic adenopathy (previously hypermetabolic). 2. 1.4 cm in long axis cystic lesion in the pancreatic head probably in continuity with the mildly dilated ventral pancreatic duct.  There is a chance this could represent a small indolent intraductal papillary mucinous neoplasm. This could be followed in the context of the patient's presumed follow up oncology imaging. 3. Other imaging findin

## 2022-02-24 NOTE — ED Triage Notes (Signed)
Pt c/o three days of intermittent epigastric and RUQ abdominal pain. Pt denies N/V/D, fever. Pt with stage four bladder cancer, on cisplatin, gemcitabine, and magnesium. Next tx tomorrow.  ?

## 2022-02-25 ENCOUNTER — Inpatient Hospital Stay (HOSPITAL_BASED_OUTPATIENT_CLINIC_OR_DEPARTMENT_OTHER): Payer: 59 | Admitting: Oncology

## 2022-02-25 ENCOUNTER — Other Ambulatory Visit: Payer: Self-pay | Admitting: *Deleted

## 2022-02-25 ENCOUNTER — Inpatient Hospital Stay: Payer: 59

## 2022-02-25 ENCOUNTER — Other Ambulatory Visit: Payer: Self-pay

## 2022-02-25 VITALS — BP 102/62 | HR 69 | Temp 97.8°F | Resp 18 | Ht 73.0 in | Wt 185.7 lb

## 2022-02-25 DIAGNOSIS — D701 Agranulocytosis secondary to cancer chemotherapy: Secondary | ICD-10-CM | POA: Diagnosis not present

## 2022-02-25 DIAGNOSIS — R69 Illness, unspecified: Secondary | ICD-10-CM | POA: Diagnosis not present

## 2022-02-25 DIAGNOSIS — C679 Malignant neoplasm of bladder, unspecified: Secondary | ICD-10-CM

## 2022-02-25 DIAGNOSIS — Z95828 Presence of other vascular implants and grafts: Secondary | ICD-10-CM

## 2022-02-25 DIAGNOSIS — C678 Malignant neoplasm of overlapping sites of bladder: Secondary | ICD-10-CM

## 2022-02-25 DIAGNOSIS — Z5111 Encounter for antineoplastic chemotherapy: Secondary | ICD-10-CM | POA: Diagnosis not present

## 2022-02-25 DIAGNOSIS — T451X5D Adverse effect of antineoplastic and immunosuppressive drugs, subsequent encounter: Secondary | ICD-10-CM | POA: Diagnosis not present

## 2022-02-25 LAB — CBC WITH DIFFERENTIAL (CANCER CENTER ONLY)
Abs Immature Granulocytes: 0 10*3/uL (ref 0.00–0.07)
Basophils Absolute: 0 10*3/uL (ref 0.0–0.1)
Basophils Relative: 0 %
Eosinophils Absolute: 0 10*3/uL (ref 0.0–0.5)
Eosinophils Relative: 1 %
HCT: 30.4 % — ABNORMAL LOW (ref 39.0–52.0)
Hemoglobin: 10.4 g/dL — ABNORMAL LOW (ref 13.0–17.0)
Immature Granulocytes: 0 %
Lymphocytes Relative: 26 %
Lymphs Abs: 0.8 10*3/uL (ref 0.7–4.0)
MCH: 30.2 pg (ref 26.0–34.0)
MCHC: 34.2 g/dL (ref 30.0–36.0)
MCV: 88.4 fL (ref 80.0–100.0)
Monocytes Absolute: 0.5 10*3/uL (ref 0.1–1.0)
Monocytes Relative: 15 %
Neutro Abs: 1.8 10*3/uL (ref 1.7–7.7)
Neutrophils Relative %: 58 %
Platelet Count: 218 10*3/uL (ref 150–400)
RBC: 3.44 MIL/uL — ABNORMAL LOW (ref 4.22–5.81)
RDW: 14.8 % (ref 11.5–15.5)
WBC Count: 3.1 10*3/uL — ABNORMAL LOW (ref 4.0–10.5)
nRBC: 0 % (ref 0.0–0.2)

## 2022-02-25 LAB — CMP (CANCER CENTER ONLY)
ALT: 6 U/L (ref 0–44)
AST: 13 U/L — ABNORMAL LOW (ref 15–41)
Albumin: 3.7 g/dL (ref 3.5–5.0)
Alkaline Phosphatase: 63 U/L (ref 38–126)
Anion gap: 6 (ref 5–15)
BUN: 6 mg/dL — ABNORMAL LOW (ref 8–23)
CO2: 29 mmol/L (ref 22–32)
Calcium: 8.9 mg/dL (ref 8.9–10.3)
Chloride: 103 mmol/L (ref 98–111)
Creatinine: 0.85 mg/dL (ref 0.61–1.24)
GFR, Estimated: >60 mL/min (ref >60)
Glucose, Bld: 161 mg/dL — ABNORMAL HIGH (ref 70–99)
Potassium: 3.8 mmol/L (ref 3.5–5.1)
Sodium: 138 mmol/L (ref 135–145)
Total Bilirubin: 0.4 mg/dL (ref 0.3–1.2)
Total Protein: 6.3 g/dL — ABNORMAL LOW (ref 6.5–8.1)

## 2022-02-25 MED ORDER — SODIUM CHLORIDE 0.9% FLUSH
10.0000 mL | Freq: Once | INTRAVENOUS | Status: AC
  Administered 2022-02-25: 10 mL

## 2022-02-25 MED ORDER — PALONOSETRON HCL INJECTION 0.25 MG/5ML
0.2500 mg | Freq: Once | INTRAVENOUS | Status: AC
  Administered 2022-02-25: 0.25 mg via INTRAVENOUS
  Filled 2022-02-25: qty 5

## 2022-02-25 MED ORDER — HEPARIN SOD (PORK) LOCK FLUSH 100 UNIT/ML IV SOLN
500.0000 [IU] | Freq: Once | INTRAVENOUS | Status: AC | PRN
  Administered 2022-02-25: 500 [IU]

## 2022-02-25 MED ORDER — SODIUM CHLORIDE 0.9 % IV SOLN
70.0000 mg/m2 | Freq: Once | INTRAVENOUS | Status: AC
  Administered 2022-02-25: 150 mg via INTRAVENOUS
  Filled 2022-02-25: qty 150

## 2022-02-25 MED ORDER — POTASSIUM CHLORIDE IN NACL 20-0.9 MEQ/L-% IV SOLN
Freq: Once | INTRAVENOUS | Status: AC
  Filled 2022-02-25: qty 1000

## 2022-02-25 MED ORDER — SODIUM CHLORIDE 0.9 % IV SOLN: Freq: Once | INTRAVENOUS | Status: AC

## 2022-02-25 MED ORDER — SODIUM CHLORIDE 0.9 % IV SOLN
10.0000 mg | Freq: Once | INTRAVENOUS | Status: AC
  Administered 2022-02-25: 10 mg via INTRAVENOUS
  Filled 2022-02-25: qty 10

## 2022-02-25 MED ORDER — SODIUM CHLORIDE 0.9 % IV SOLN
150.0000 mg | Freq: Once | INTRAVENOUS | Status: AC
  Administered 2022-02-25: 150 mg via INTRAVENOUS
  Filled 2022-02-25: qty 150

## 2022-02-25 MED ORDER — SODIUM CHLORIDE 0.9 % IV SOLN
800.0000 mg/m2 | Freq: Once | INTRAVENOUS | Status: AC
  Administered 2022-02-25: 1710 mg via INTRAVENOUS
  Filled 2022-02-25: qty 44.97

## 2022-02-25 MED ORDER — SODIUM CHLORIDE 0.9% FLUSH
10.0000 mL | INTRAVENOUS | Status: DC | PRN
  Administered 2022-02-25: 10 mL

## 2022-02-25 MED ORDER — MAGNESIUM SULFATE 2 GM/50ML IV SOLN
2.0000 g | Freq: Once | INTRAVENOUS | Status: AC
  Administered 2022-02-25: 2 g via INTRAVENOUS
  Filled 2022-02-25: qty 50

## 2022-02-25 NOTE — Progress Notes (Signed)
Hematology and Oncology Follow Up Visit  Jonathan Ray [id number] 04-22-1956 66 y.o. 02/25/2022 8:14 AM Ty Hilts, Mathis Dad, MD   Principle Diagnosis: 17 year old man with stage IV bladder cancer with omental and lymphadenopathy noted in March 2023.  He was initially diagnosed with a poorly differentiated tumor, T3a in 2021.     Prior Therapy: He is status post partial cystectomy and found to have T3a poorly differentiated carcinoma after a surgery that was completed on April 08, 2020.    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  He was treated with BCG for 6 weeks completed in August 2022.    MRI obtained on December 12, 2021 which showed a severe thickening and hyperenhancement of the anterior dome of the bladder at least measuring 2.1 cm thickness with suspicious findings of recurrent or residual bladder malignancy   Current therapy: Gemcitabine and cisplatin chemotherapy started on January 14, 2022.  He is here for day 1 cycle 3 of therapy.  Interim History: Mr. Gabrielson presents today for repeat evaluation.  Since the last visit, he has developed worsening abdominal pain and was seen in the emergency department on Feb 24, 2022.  CT scan of the abdomen and pelvis did not show any acute pathology.  He feels well today without any complaints.  He denies any nausea, vomiting or abdominal pain.  He denies any changes in his bowel habits.  He denies any worsening neuropathy or excessive fatigue.     Medications: Updated on review. Current Outpatient Medications  Medication Sig Dispense Refill   diazepam (VALIUM) 10 MG tablet Take 10 mg by mouth daily.     lidocaine-prilocaine (EMLA) cream Apply 1 application. topically as needed. 30 g 0   meloxicam (MOBIC) 15 MG tablet Take 15 mg by mouth daily. (Patient not taking: Reported on 02/24/2022)     oxyCODONE-acetaminophen (PERCOCET/ROXICET) 5-325 MG tablet Take 1 tablet by mouth every 6  (six) hours as needed for severe pain. 15 tablet 0   tamsulosin (FLOMAX) 0.4 MG CAPS capsule Take 0.4 mg by mouth daily.     No current facility-administered medications for this visit.   Facility-Administered Medications Ordered in Other Visits  Medication Dose Route Frequency Provider Last Rate Last Admin   gemcitabine (GEMZAR) chemo syringe for bladder instillation 2,000 mg  2,000 mg Bladder Instillation Once Jacalyn Lefevre D, MD       sodium chloride flush (NS) 0.9 % injection 10 mL  10 mL Intracatheter Once [name], MD         Allergies:  Allergies  Allergen Reactions   Penicillins Other (See Comments)    Does not remember what happens-childhood allergy Has patient had a PCN reaction causing immediate rash, facial/tongue/throat swelling, SOB or lightheadedness with hypotension: Unknown Has patient had a PCN reaction causing severe rash involving mucus membranes or skin necrosis: Unknown Has patient had a PCN reaction that required hospitalization Unknown Has patient had a PCN reaction occurring within the last 10 years: No If all of the above answers are "NO", then may proceed with Cephalosporin use    Physical Exam: Blood pressure 102/62, pulse 69, temperature 97.8 F (36.6 C), temperature source Axillary, resp. rate 18, height '6\' 1"'$  (1.854 m), weight 185 lb 11.2 oz (84.2 kg), SpO2 100 %.  ECOG: 1   General appearance: Comfortable appearing without any discomfort Head: Normocephalic without any trauma Oropharynx: Mucous membranes are moist and pink without  any thrush or ulcers. Eyes: Pupils are equal and round reactive to light. Lymph nodes: No cervical, supraclavicular, inguinal or axillary lymphadenopathy.   Heart:regular rate and rhythm.  S1 and S2 without leg edema. Lung: Clear without any rhonchi or wheezes.  No dullness to percussion. Abdomin: Soft, nontender, nondistended with good bowel sounds.  No hepatosplenomegaly. Musculoskeletal: No joint deformity  or effusion.  Full range of motion noted. Neurological: No deficits noted on motor, sensory and deep tendon reflex exam. Skin: No petechial rash or dryness.  Appeared moist.     Lab Results: Lab Results  Component Value Date   WBC 3.0 (L) 02/24/2022   HGB 10.8 (L) 02/24/2022   HCT 31.3 (L) 02/24/2022   MCV 90.2 02/24/2022   PLT 200 02/24/2022     Chemistry      Component Value Date/Time   NA 138 02/24/2022 1221   NA 143 02/10/2017 1111   K 3.8 02/24/2022 1221   CL 106 02/24/2022 1221   CO2 26 02/24/2022 1221   BUN 8 02/24/2022 1221   BUN 8 02/10/2017 1111   CREATININE 0.73 02/24/2022 1221   CREATININE 0.79 02/11/2022 1220      Component Value Date/Time   CALCIUM 8.6 (L) 02/24/2022 1221   ALKPHOS 61 02/24/2022 1221   AST 14 (L) 02/24/2022 1221   AST 15 02/11/2022 1220   ALT 10 02/24/2022 1221   ALT 15 02/11/2022 1220   BILITOT 0.5 02/24/2022 1221   BILITOT 0.3 02/11/2022 1220      IMPRESSION: 1. Persistent large tumor in the anterior roof of the urinary bladder, with indistinct tissue planes between this mass and adjacent loops of small bowel. Increased omental tumor burden compared to 01/15/2022. Trace free pelvic fluid. Stable mild pelvic adenopathy (previously hypermetabolic). 2. 1.4 cm in long axis cystic lesion in the pancreatic head probably in continuity with the mildly dilated ventral pancreatic duct. There is a chance this could represent a small indolent intraductal papillary mucinous neoplasm. This could be followed in the context of the patient's presumed follow up oncology imaging. 3. Other imaging findings of potential clinical significance: Mild atelectasis in the right lower lobe. Scattered tiny hepatic cysts. Mild prostatomegaly. Prior left orchiectomy.      Impression and Plan:  66 year old with:  1.  Bladder cancer diagnosed in 2021 with localized disease.  He developed stage IV poorly differentiated tumor with lymphadenopathy with  increased omental tumor burden   He is currently on salvage therapy utilizing gemcitabine and cisplatin without any major complications.  He did have abdominal pain CT scan obtained on Feb 24, 2022 showed a persistent large tumor with increase in the omental burden.  Risks and benefits of continuing the current chemotherapy versus switching to different salvage treatment including immunotherapy were reviewed.  After discussion today, I opted to give him a least 1 more cycle of platinum based regimen before consideration of switching to immunotherapy.   2.  IV access: Port-A-Cath continues to be in use without any issues.   3.  Antiemetics: No nausea or vomiting reported.  Compazine is available to him.   4.  Renal function surveillance: Kidney function remains normal we will continue to monitor his creatinine clearance.  5.  Neutropenia: Absolute neutrophil count is adequate.  Growth factor support can be considered in the future.   5.  Goals of care: Therapy is likely palliative at this point although aggressive measures are warranted.  6.  Abdominal pain: Resolved at this time with unclear  etiology.  Could be related to his bladder cancer.  He does not require any additional pain medication.   7.  Follow-up: He will return in 1 week for repeat follow-up and to complete the current cycle of chemotherapy.     30  minutes were dedicated to this visit.  The time was spent on reviewing laboratory data, imaging studies, discussing treatment choices and future plan of care review.       Zola Button, MD 5/18/20238:14 AM

## 2022-02-25 NOTE — Patient Instructions (Signed)
[facility] ONCOLOGY  Discharge Instructions: Thank you for choosing [facility] to provide your oncology and hematology care.   If you have a lab appointment with the [facility], please go directly to the [facility] and check in at the registration area.   Wear comfortable clothing and clothing appropriate for easy access to any Portacath or PICC line.   We strive to give you quality time with your provider. You may need to reschedule your appointment if you arrive late (15 or more minutes).  Arriving late affects you and other patients whose appointments are after yours.  Also, if you miss three or more appointments without notifying the office, you may be dismissed from the clinic at the provider's discretion.      For prescription refill requests, have your pharmacy contact our office and allow 72 hours for refills to be completed.    Today you received the following chemotherapy and/or immunotherapy agents: Gemzar & Cisplatin      To help prevent nausea and vomiting after your treatment, we encourage you to take your nausea medication as directed.  BELOW ARE SYMPTOMS THAT SHOULD BE REPORTED IMMEDIATELY: *FEVER GREATER THAN 100.4 F (38 C) OR HIGHER *CHILLS OR SWEATING *NAUSEA AND VOMITING THAT IS NOT CONTROLLED WITH YOUR NAUSEA MEDICATION *UNUSUAL SHORTNESS OF BREATH *UNUSUAL BRUISING OR BLEEDING *URINARY PROBLEMS (pain or burning when urinating, or frequent urination) *BOWEL PROBLEMS (unusual diarrhea, constipation, pain near the anus) TENDERNESS IN MOUTH AND THROAT WITH OR WITHOUT PRESENCE OF ULCERS (sore throat, sores in mouth, or a toothache) UNUSUAL RASH, SWELLING OR PAIN  UNUSUAL VAGINAL DISCHARGE OR ITCHING   Items with * indicate a potential emergency and should be followed up as soon as possible or go to the Emergency Department if any problems should occur.  Please show the CHEMOTHERAPY ALERT CARD or IMMUNOTHERAPY ALERT CARD at  check-in to the Emergency Department and triage nurse.  Should you have questions after your visit or need to cancel or reschedule your appointment, please contact [facility]  Dept: [phone]  and follow the prompts.  Office hours are 8:00 a.m. to 4:30 p.m. Monday - Friday. Please note that voicemails left after 4:00 p.m. may not be returned until the following business day.  We are closed weekends and major holidays. You have access to a nurse at all times for urgent questions. Please call the main number to the clinic Dept: [phone] and follow the prompts.   For any non-urgent questions, you may also contact your provider using MyChart. We now offer e-Visits for anyone 91 and older to request care online for non-urgent symptoms. For details visit [email or web address].   Also download the MyChart app! Go to the app store, search "MyChart", open the app, select [facility], and log in with your MyChart username and password.  Due to Covid, a mask is required upon entering the hospital/clinic. If you do not have a mask, one will be given to you upon arrival. For doctor visits, patients may have 1 support Maythe Deramo aged 65 or older with them. For treatment visits, patients cannot have anyone with them due to current Covid guidelines and our immunocompromised population.

## 2022-02-25 NOTE — Progress Notes (Signed)
Ok to proceed today without updated Magnesium level per Dr. Alen Blew.

## 2022-03-04 ENCOUNTER — Inpatient Hospital Stay: Payer: 59

## 2022-03-04 ENCOUNTER — Other Ambulatory Visit: Payer: Self-pay

## 2022-03-04 VITALS — BP 131/70 | HR 69 | Temp 98.2°F | Resp 18 | Wt 184.8 lb

## 2022-03-04 DIAGNOSIS — Z95828 Presence of other vascular implants and grafts: Secondary | ICD-10-CM

## 2022-03-04 DIAGNOSIS — T451X5D Adverse effect of antineoplastic and immunosuppressive drugs, subsequent encounter: Secondary | ICD-10-CM | POA: Diagnosis not present

## 2022-03-04 DIAGNOSIS — R69 Illness, unspecified: Secondary | ICD-10-CM | POA: Diagnosis not present

## 2022-03-04 DIAGNOSIS — D701 Agranulocytosis secondary to cancer chemotherapy: Secondary | ICD-10-CM | POA: Diagnosis not present

## 2022-03-04 DIAGNOSIS — Z5111 Encounter for antineoplastic chemotherapy: Secondary | ICD-10-CM | POA: Diagnosis not present

## 2022-03-04 DIAGNOSIS — C678 Malignant neoplasm of overlapping sites of bladder: Secondary | ICD-10-CM

## 2022-03-04 DIAGNOSIS — C679 Malignant neoplasm of bladder, unspecified: Secondary | ICD-10-CM

## 2022-03-04 LAB — CBC WITH DIFFERENTIAL (CANCER CENTER ONLY)
Abs Immature Granulocytes: 0.01 10*3/uL (ref 0.00–0.07)
Basophils Absolute: 0 10*3/uL (ref 0.0–0.1)
Basophils Relative: 1 %
Eosinophils Absolute: 0 10*3/uL (ref 0.0–0.5)
Eosinophils Relative: 1 %
HCT: 30.4 % — ABNORMAL LOW (ref 39.0–52.0)
Hemoglobin: 10.8 g/dL — ABNORMAL LOW (ref 13.0–17.0)
Immature Granulocytes: 0 %
Lymphocytes Relative: 46 %
Lymphs Abs: 1.2 10*3/uL (ref 0.7–4.0)
MCH: 30.5 pg (ref 26.0–34.0)
MCHC: 35.5 g/dL (ref 30.0–36.0)
MCV: 85.9 fL (ref 80.0–100.0)
Monocytes Absolute: 0.3 10*3/uL (ref 0.1–1.0)
Monocytes Relative: 11 %
Neutro Abs: 1 10*3/uL — ABNORMAL LOW (ref 1.7–7.7)
Neutrophils Relative %: 41 %
Platelet Count: 224 10*3/uL (ref 150–400)
RBC: 3.54 MIL/uL — ABNORMAL LOW (ref 4.22–5.81)
RDW: 14.3 % (ref 11.5–15.5)
WBC Count: 2.6 10*3/uL — ABNORMAL LOW (ref 4.0–10.5)
nRBC: 0 % (ref 0.0–0.2)

## 2022-03-04 LAB — CMP (CANCER CENTER ONLY)
ALT: 13 U/L (ref 0–44)
AST: 15 U/L (ref 15–41)
Albumin: 4.1 g/dL (ref 3.5–5.0)
Alkaline Phosphatase: 65 U/L (ref 38–126)
Anion gap: 7 (ref 5–15)
BUN: 8 mg/dL (ref 8–23)
CO2: 30 mmol/L (ref 22–32)
Calcium: 9.3 mg/dL (ref 8.9–10.3)
Chloride: 97 mmol/L — ABNORMAL LOW (ref 98–111)
Creatinine: 0.79 mg/dL (ref 0.61–1.24)
GFR, Estimated: >60 mL/min (ref >60)
Glucose, Bld: 122 mg/dL — ABNORMAL HIGH (ref 70–99)
Potassium: 4.2 mmol/L (ref 3.5–5.1)
Sodium: 134 mmol/L — ABNORMAL LOW (ref 135–145)
Total Bilirubin: 0.4 mg/dL (ref 0.3–1.2)
Total Protein: 6.7 g/dL (ref 6.5–8.1)

## 2022-03-04 MED ORDER — SODIUM CHLORIDE 0.9% FLUSH
10.0000 mL | INTRAVENOUS | Status: DC | PRN
  Administered 2022-03-04: 10 mL

## 2022-03-04 MED ORDER — SODIUM CHLORIDE 0.9 % IV SOLN
800.0000 mg/m2 | Freq: Once | INTRAVENOUS | Status: AC
  Administered 2022-03-04: 1710 mg via INTRAVENOUS
  Filled 2022-03-04: qty 44.97

## 2022-03-04 MED ORDER — HEPARIN SOD (PORK) LOCK FLUSH 100 UNIT/ML IV SOLN
500.0000 [IU] | Freq: Once | INTRAVENOUS | Status: AC | PRN
  Administered 2022-03-04: 500 [IU]

## 2022-03-04 MED ORDER — SODIUM CHLORIDE 0.9 % IV SOLN: Freq: Once | INTRAVENOUS | Status: AC

## 2022-03-04 MED ORDER — PROCHLORPERAZINE MALEATE 10 MG PO TABS
10.0000 mg | ORAL_TABLET | Freq: Once | ORAL | Status: AC
  Administered 2022-03-04: 10 mg via ORAL
  Filled 2022-03-04: qty 1

## 2022-03-04 MED ORDER — SODIUM CHLORIDE 0.9% FLUSH
10.0000 mL | Freq: Once | INTRAVENOUS | Status: AC
  Administered 2022-03-04: 10 mL

## 2022-03-04 NOTE — Patient Instructions (Signed)
[facility]COL[facility]  Discharge Instructions: Thank you for choosing [facility] to provide your oncology and hematology care.   If you have a lab appointment with the Cancer Center, please go directly to the Cancer Center and check in at the registration area.   Wear comfortable clothing and clothing appropriate for easy access to any Portacath or PICC line.   We strive to give you quality time with your provider. You may need to reschedule your appointment if you arrive late (15 or more minutes).  Arriving late affects you and other patients whose appointments are after yours.  Also, if you miss three or more appointments without notifying the office, you may be dismissed from the clinic at the provider's discretion.      For prescription refill requests, have your pharmacy contact our office and allow 72 hours for refills to be completed.    Today you received the following chemotherapy and/or immunotherapy agents Gemzar      To help prevent nausea and vomiting after your treatment, we encourage you to take your nausea medication as directed.  BELOW ARE SYMPTOMS THAT SHOULD BE REPORTED IMMEDIATELY: *FEVER GREATER THAN 100.4 F (38 C) OR HIGHER *CHILLS OR SWEATING *NAUSEA AND VOMITING THAT IS NOT CONTROLLED WITH YOUR NAUSEA MEDICATION *UNUSUAL SHORTNESS OF BREATH *UNUSUAL BRUISING OR BLEEDING *URINARY PROBLEMS (pain or burning when urinating, or frequent urination) *BOWEL PROBLEMS (unusual diarrhea, constipation, pain near the anus) TENDERNESS IN MOUTH AND THROAT WITH OR WITHOUT PRESENCE OF ULCERS (sore throat, sores in mouth, or a toothache) UNUSUAL RASH, SWELLING OR PAIN  UNUSUAL VAGINAL DISCHARGE OR ITCHING   Items with * indicate a potential emergency and should be followed up as soon as possible or go to the Emergency Department if any problems should occur.  Please show the CHEMOTHERAPY ALERT CARD or IMMUNOTHERAPY ALERT CARD at check-in to the  Emergency Department and triage nurse.  Should you have questions after your visit or need to cancel or reschedule your appointment, please contact [facility]  Dept: [phone]  and follow the prompts.  Office hours are 8:00 a.m. to 4:30 p.m. Monday - Friday. Please note that voicemails left after 4:00 p.m. may not be returned until the following business day.  We are closed weekends and major holidays. You have access to a nurse at all times for urgent questions. Please call the main number to the clinic Dept: [phone] and follow the prompts.   For any non-urgent questions, you may also contact your provider using MyChart. We now offer e-Visits for anyone 18 and older to request care online for non-urgent symptoms. For details visit [email or web address].   Also download the MyChart app! Go to the app store, search "MyChart", open the app, select , and log in with your MyChart username and password.  Due to Covid, a mask is required upon entering the hospital/clinic. If you do not have a mask, one will be given to you upon arrival. For doctor visits, patients may have 1 support Hasaan Radde aged 18 or older with them. For treatment visits, patients cannot have anyone with them due to current Covid guidelines and our immunocompromised population.   

## 2022-03-04 NOTE — Progress Notes (Signed)
Per Dr. Alen Blew, okay to treat today with ANC 1.0

## 2022-03-09 ENCOUNTER — Telehealth: Payer: Self-pay | Admitting: Oncology

## 2022-03-09 NOTE — Telephone Encounter (Signed)
Called patient regarding upcoming appointments, left a voicemail. 

## 2022-03-18 MED FILL — Dexamethasone Sodium Phosphate Inj 100 MG/10ML: INTRAMUSCULAR | Qty: 1 | Status: AC

## 2022-03-18 MED FILL — Fosaprepitant Dimeglumine For IV Infusion 150 MG (Base Eq): INTRAVENOUS | Qty: 5 | Status: AC

## 2022-03-19 ENCOUNTER — Inpatient Hospital Stay (HOSPITAL_BASED_OUTPATIENT_CLINIC_OR_DEPARTMENT_OTHER): Payer: 59 | Admitting: Oncology

## 2022-03-19 ENCOUNTER — Other Ambulatory Visit: Payer: Self-pay

## 2022-03-19 ENCOUNTER — Inpatient Hospital Stay: Payer: 59

## 2022-03-19 ENCOUNTER — Inpatient Hospital Stay: Payer: 59 | Attending: Oncology

## 2022-03-19 VITALS — BP 188/84 | HR 92 | Temp 98.5°F | Resp 18 | Ht 73.0 in | Wt 188.6 lb

## 2022-03-19 DIAGNOSIS — Z95828 Presence of other vascular implants and grafts: Secondary | ICD-10-CM

## 2022-03-19 DIAGNOSIS — T451X5D Adverse effect of antineoplastic and immunosuppressive drugs, subsequent encounter: Secondary | ICD-10-CM | POA: Insufficient documentation

## 2022-03-19 DIAGNOSIS — D701 Agranulocytosis secondary to cancer chemotherapy: Secondary | ICD-10-CM | POA: Diagnosis not present

## 2022-03-19 DIAGNOSIS — Z79899 Other long term (current) drug therapy: Secondary | ICD-10-CM | POA: Insufficient documentation

## 2022-03-19 DIAGNOSIS — C679 Malignant neoplasm of bladder, unspecified: Secondary | ICD-10-CM | POA: Diagnosis not present

## 2022-03-19 DIAGNOSIS — N400 Enlarged prostate without lower urinary tract symptoms: Secondary | ICD-10-CM | POA: Diagnosis not present

## 2022-03-19 DIAGNOSIS — Z5112 Encounter for antineoplastic immunotherapy: Secondary | ICD-10-CM | POA: Diagnosis present

## 2022-03-19 DIAGNOSIS — F17210 Nicotine dependence, cigarettes, uncomplicated: Secondary | ICD-10-CM | POA: Diagnosis not present

## 2022-03-19 DIAGNOSIS — K862 Cyst of pancreas: Secondary | ICD-10-CM | POA: Diagnosis not present

## 2022-03-19 LAB — CMP (CANCER CENTER ONLY)
ALT: 8 U/L (ref 0–44)
AST: 12 U/L — ABNORMAL LOW (ref 15–41)
Albumin: 4.1 g/dL (ref 3.5–5.0)
Alkaline Phosphatase: 58 U/L (ref 38–126)
Anion gap: 7 (ref 5–15)
BUN: 8 mg/dL (ref 8–23)
CO2: 30 mmol/L (ref 22–32)
Calcium: 9.5 mg/dL (ref 8.9–10.3)
Chloride: 97 mmol/L — ABNORMAL LOW (ref 98–111)
Creatinine: 0.93 mg/dL (ref 0.61–1.24)
GFR, Estimated: >60 mL/min (ref >60)
Glucose, Bld: 105 mg/dL — ABNORMAL HIGH (ref 70–99)
Potassium: 4 mmol/L (ref 3.5–5.1)
Sodium: 134 mmol/L — ABNORMAL LOW (ref 135–145)
Total Bilirubin: 0.4 mg/dL (ref 0.3–1.2)
Total Protein: 6.6 g/dL (ref 6.5–8.1)

## 2022-03-19 LAB — CBC WITH DIFFERENTIAL (CANCER CENTER ONLY)
Abs Immature Granulocytes: 0.01 10*3/uL (ref 0.00–0.07)
Basophils Absolute: 0 10*3/uL (ref 0.0–0.1)
Basophils Relative: 0 %
Eosinophils Absolute: 0 10*3/uL (ref 0.0–0.5)
Eosinophils Relative: 1 %
HCT: 30.2 % — ABNORMAL LOW (ref 39.0–52.0)
Hemoglobin: 10.5 g/dL — ABNORMAL LOW (ref 13.0–17.0)
Immature Granulocytes: 0 %
Lymphocytes Relative: 24 %
Lymphs Abs: 0.8 10*3/uL (ref 0.7–4.0)
MCH: 30.9 pg (ref 26.0–34.0)
MCHC: 34.8 g/dL (ref 30.0–36.0)
MCV: 88.8 fL (ref 80.0–100.0)
Monocytes Absolute: 0.6 10*3/uL (ref 0.1–1.0)
Monocytes Relative: 19 %
Neutro Abs: 1.8 10*3/uL (ref 1.7–7.7)
Neutrophils Relative %: 56 %
Platelet Count: 262 10*3/uL (ref 150–400)
RBC: 3.4 MIL/uL — ABNORMAL LOW (ref 4.22–5.81)
RDW: 16.4 % — ABNORMAL HIGH (ref 11.5–15.5)
WBC Count: 3.3 10*3/uL — ABNORMAL LOW (ref 4.0–10.5)
nRBC: 0 % (ref 0.0–0.2)

## 2022-03-19 MED ORDER — SODIUM CHLORIDE 0.9% FLUSH
10.0000 mL | Freq: Once | INTRAVENOUS | Status: AC
  Administered 2022-03-19: 10 mL

## 2022-03-19 MED ORDER — HEPARIN SOD (PORK) LOCK FLUSH 100 UNIT/ML IV SOLN
500.0000 [IU] | Freq: Once | INTRAVENOUS | Status: AC
  Administered 2022-03-19: 500 [IU]

## 2022-03-19 NOTE — Progress Notes (Signed)
DISCONTINUE ON PATHWAY REGIMEN - Bladder     A cycle is every 21 days:     Gemcitabine      Cisplatin   **Always confirm dose/schedule in your pharmacy ordering system**  REASON: Disease Progression PRIOR TREATMENT: [id number]: Gemcitabine 1,000 mg/m2 D1, 8 + Cisplatin 70 mg/m2 D1 q21 Days x 4 Cycles TREATMENT RESPONSE: Progressive Disease (PD)  START ON PATHWAY REGIMEN - Bladder     A cycle is every 21 days:     Pembrolizumab   **Always confirm dose/schedule in your pharmacy ordering system**  Patient Characteristics: Advanced/Metastatic Disease, Second Line, FGFR2/FGFR3 Mutation Negative or Unknown, Prior Platinum-Based Therapy and No Prior PD-1/PD-L1 Inhibitor Therapeutic Status: Advanced/Metastatic Disease Line of Therapy: Second Line FGFR2/FGFR3 Mutation Status: Awaiting Test Results Intent of Therapy: Non-Curative / Palliative Intent, Discussed with Patient

## 2022-03-19 NOTE — Progress Notes (Signed)
Pharmacist Chemotherapy Monitoring - Initial Assessment    Anticipated start date: 03/26/22   The following has been reviewed per standard work regarding the patient's treatment regimen: The patient's diagnosis, treatment plan and drug doses, and organ/hematologic function Lab orders and baseline tests specific to treatment regimen  The treatment plan start date, drug sequencing, and pre-medications Prior authorization status  Patient's documented medication list, including drug-drug interaction screen and prescriptions for anti-emetics and supportive care specific to the treatment regimen The drug concentrations, fluid compatibility, administration routes, and timing of the medications to be used The patient's access for treatment and lifetime cumulative dose history, if applicable  The patient's medication allergies and previous infusion related reactions, if applicable   Changes made to treatment plan:  N/A  Follow up needed:  Pending authorization for treatment    Larene Beach, [place], 03/19/2022  11:20 AM

## 2022-03-19 NOTE — Progress Notes (Signed)
Hematology and Oncology Follow Up Visit  Jonathan Ray [id number] [birth year] 66 y.o. 03/19/2022 9:13 AM Jonathan Sers Julien Girt, DO   Principle Diagnosis: 66 year old man with bladder cancer diagnosed in 2021.  He developed stage IV disease with omental and lymphadenopathy noted in March 2023.     Prior Therapy: He is status post partial cystectomy and found to have T3a poorly differentiated carcinoma after a surgery that was completed on April 08, 2020.    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  He was treated with BCG for 6 weeks completed in August 2022.    MRI obtained on December 12, 2021 which showed a severe thickening and hyperenhancement of the anterior dome of the bladder at least measuring 2.1 cm thickness with suspicious findings of recurrent or residual bladder malignancy  Gemcitabine and cisplatin chemotherapy started on January 14, 2022.  He completed 3 cycles of therapy.   Current therapy: Under evaluation to switch to different systemic therapy.  Interim History: Jonathan Ray returns today for a follow-up visit.  Since the last visit, he reports no major changes in his health.  He tolerated the chemotherapy regimen without any major complications.  He denies any nausea, vomiting or abdominal pain.  He denies any worsening neuropathy or fatigue.  His performance status quality of life remains unchanged.     Medications: Reviewed without changes. Current Outpatient Medications  Medication Sig Dispense Refill   diazepam (VALIUM) 10 MG tablet Take 10 mg by mouth daily.     lidocaine-prilocaine (EMLA) cream Apply 1 application. topically as needed. 30 g 0   meloxicam (MOBIC) 15 MG tablet Take 15 mg by mouth daily. (Patient not taking: Reported on 02/24/2022)     oxyCODONE-acetaminophen (PERCOCET/ROXICET) 5-325 MG tablet Take 1 tablet by mouth every 6 (six) hours as needed for severe pain. 15 tablet 0   tamsulosin (FLOMAX)  0.4 MG CAPS capsule Take 0.4 mg by mouth daily.     No current facility-administered medications for this visit.   Facility-Administered Medications Ordered in Other Visits  Medication Dose Route Frequency Provider Last Rate Last Admin   gemcitabine (GEMZAR) chemo syringe for bladder instillation 2,000 mg  2,000 mg Bladder Instillation Once [name], MD         Allergies:  Allergies  Allergen Reactions   Penicillins Other (See Comments)    Does not remember what happens-childhood allergy Has patient had a PCN reaction causing immediate rash, facial/tongue/throat swelling, SOB or lightheadedness with hypotension: Unknown Has patient had a PCN reaction causing severe rash involving mucus membranes or skin necrosis: Unknown Has patient had a PCN reaction that required hospitalization Unknown Has patient had a PCN reaction occurring within the last 10 years: No If all of the above answers are "NO", then may proceed with Cephalosporin use    Physical Exam:  Blood pressure (!) 188/84, pulse 92, temperature 98.5 F (36.9 C), resp. rate 18, height '6\' 1"'$  (1.854 m), weight 188 lb 9.6 oz (85.5 kg), SpO2 100 %.  ECOG: 1   General appearance: Alert, awake without any distress. Head: Atraumatic without abnormalities Oropharynx: Without any thrush or ulcers. Eyes: No scleral icterus. Lymph nodes: No lymphadenopathy noted in the cervical, supraclavicular, or axillary nodes Heart:regular rate and rhythm, without any murmurs or gallops.   Lung: Clear to auscultation without any rhonchi, wheezes or dullness to percussion. Abdomin: Soft, nontender without any shifting dullness or ascites. Musculoskeletal: No clubbing  or cyanosis. Neurological: No motor or sensory deficits. Skin: No rashes or lesions.   Lab Results: Lab Results  Component Value Date   WBC 2.6 (L) 03/04/2022   HGB 10.8 (L) 03/04/2022   HCT 30.4 (L) 03/04/2022   MCV 85.9 03/04/2022   PLT 224 03/04/2022      Chemistry      Component Value Date/Time   NA 134 (L) 03/04/2022 1206   NA 143 02/10/2017 1111   K 4.2 03/04/2022 1206   CL 97 (L) 03/04/2022 1206   CO2 30 03/04/2022 1206   BUN 8 03/04/2022 1206   BUN 8 02/10/2017 1111   CREATININE 0.79 03/04/2022 1206      Component Value Date/Time   CALCIUM 9.3 03/04/2022 1206   ALKPHOS 65 03/04/2022 1206   AST 15 03/04/2022 1206   ALT 13 03/04/2022 1206   BILITOT 0.4 03/04/2022 1206      IMPRESSION: 1. Persistent large tumor in the anterior roof of the urinary bladder, with indistinct tissue planes between this mass and adjacent loops of small bowel. Increased omental tumor burden compared to 01/15/2022. Trace free pelvic fluid. Stable mild pelvic adenopathy (previously hypermetabolic). 2. 1.4 cm in long axis cystic lesion in the pancreatic head probably in continuity with the mildly dilated ventral pancreatic duct. There is a chance this could represent a small indolent intraductal papillary mucinous neoplasm. This could be followed in the context of the patient's presumed follow up oncology imaging. 3. Other imaging findings of potential clinical significance: Mild atelectasis in the right lower lobe. Scattered tiny hepatic cysts. Mild prostatomegaly. Prior left orchiectomy.      Impression and Plan:  66 year old with:  1.  Stage IV bladder cancer with poorly differentiated histology with lymphadenopathy with increased omental tumor burden.    Risks and benefits of continuing the current regimen with the chemotherapy versus switching to immunotherapy were discussed at this time.  His CT scan in Feb 24, 2022 showed persistent large tumor which could confer resistance to systemic chemotherapy.   I discussed these options with the patient and his wife extensively today.  His disease is incurable and any treatment at this time is palliative and has been since his disease has progressed to be on the bladder.  The fact that he has  developed chemotherapy refractory disease is concerning and he has received the most aggressive chemotherapy as first-line.  Risks and benefits of immunotherapy were reviewed today in detail including Pembrolizumab complications that include nausea, fatigue and autoimmune complications were discussed.  After discussion he is agreeable we will switch his treatment to Pembrolizumab 200 mg every 3 weeks.  Alternative treatment options including FGFR inhibitors as well as antibody drug conjugate will be used as salvage.  Will arrange for next generation sequencing on this path specimen for future targets.   2.  IV access: Port-A-Cath remains in use without any issues.   3.  Antiemetics: No nausea or vomiting reported with Compazine available to him.   4.  Renal function surveillance: His creatinine clearance remains within normal range.  5.  Neutropenia: Related to chemotherapy and anticipate improvement on immunotherapy.   5.  Goals of care: Aggressive measures are warranted although his disease is incurable and was discussed today in detail.  6.  Immunotherapy considerations: Complications that include pneumonitis, colitis and thyroid disease were discussed today in detail.   7.  Follow-up: In 1 week For the start of Pembrolizumab     30  minutes were spent on  this visit.  The time was dedicated to reviewing laboratory data, disease status update and outlining future plan of care discussion.       Zola Button, MD 6/9/20239:13 AM

## 2022-03-25 DIAGNOSIS — R739 Hyperglycemia, unspecified: Secondary | ICD-10-CM | POA: Diagnosis not present

## 2022-03-26 ENCOUNTER — Other Ambulatory Visit: Payer: Self-pay

## 2022-03-26 ENCOUNTER — Inpatient Hospital Stay: Payer: 59

## 2022-03-26 VITALS — BP 138/79 | HR 77 | Temp 98.2°F | Resp 18 | Ht 73.0 in | Wt 187.0 lb

## 2022-03-26 DIAGNOSIS — C679 Malignant neoplasm of bladder, unspecified: Secondary | ICD-10-CM

## 2022-03-26 DIAGNOSIS — Z8547 Personal history of malignant neoplasm of testis: Secondary | ICD-10-CM | POA: Insufficient documentation

## 2022-03-26 DIAGNOSIS — R739 Hyperglycemia, unspecified: Secondary | ICD-10-CM | POA: Insufficient documentation

## 2022-03-26 DIAGNOSIS — Z906 Acquired absence of other parts of urinary tract: Secondary | ICD-10-CM | POA: Insufficient documentation

## 2022-03-26 DIAGNOSIS — C678 Malignant neoplasm of overlapping sites of bladder: Secondary | ICD-10-CM

## 2022-03-26 DIAGNOSIS — Z9079 Acquired absence of other genital organ(s): Secondary | ICD-10-CM | POA: Insufficient documentation

## 2022-03-26 DIAGNOSIS — Z95828 Presence of other vascular implants and grafts: Secondary | ICD-10-CM

## 2022-03-26 DIAGNOSIS — Z5112 Encounter for antineoplastic immunotherapy: Secondary | ICD-10-CM | POA: Diagnosis not present

## 2022-03-26 DIAGNOSIS — E785 Hyperlipidemia, unspecified: Secondary | ICD-10-CM | POA: Insufficient documentation

## 2022-03-26 DIAGNOSIS — R1030 Lower abdominal pain, unspecified: Secondary | ICD-10-CM | POA: Insufficient documentation

## 2022-03-26 DIAGNOSIS — M25511 Pain in right shoulder: Secondary | ICD-10-CM | POA: Insufficient documentation

## 2022-03-26 LAB — CBC WITH DIFFERENTIAL (CANCER CENTER ONLY)
Abs Immature Granulocytes: 0.02 10*3/uL (ref 0.00–0.07)
Basophils Absolute: 0 10*3/uL (ref 0.0–0.1)
Basophils Relative: 0 %
Eosinophils Absolute: 0 10*3/uL (ref 0.0–0.5)
Eosinophils Relative: 0 %
HCT: 29.7 % — ABNORMAL LOW (ref 39.0–52.0)
Hemoglobin: 10 g/dL — ABNORMAL LOW (ref 13.0–17.0)
Immature Granulocytes: 0 %
Lymphocytes Relative: 16 %
Lymphs Abs: 1 10*3/uL (ref 0.7–4.0)
MCH: 30.1 pg (ref 26.0–34.0)
MCHC: 33.7 g/dL (ref 30.0–36.0)
MCV: 89.5 fL (ref 80.0–100.0)
Monocytes Absolute: 0.7 10*3/uL (ref 0.1–1.0)
Monocytes Relative: 12 %
Neutro Abs: 4.3 10*3/uL (ref 1.7–7.7)
Neutrophils Relative %: 72 %
Platelet Count: 312 10*3/uL (ref 150–400)
RBC: 3.32 MIL/uL — ABNORMAL LOW (ref 4.22–5.81)
RDW: 15.8 % — ABNORMAL HIGH (ref 11.5–15.5)
WBC Count: 6.1 10*3/uL (ref 4.0–10.5)
nRBC: 0 % (ref 0.0–0.2)

## 2022-03-26 LAB — CMP (CANCER CENTER ONLY)
ALT: 5 U/L (ref 0–44)
AST: 10 U/L — ABNORMAL LOW (ref 15–41)
Albumin: 3.9 g/dL (ref 3.5–5.0)
Alkaline Phosphatase: 61 U/L (ref 38–126)
Anion gap: 6 (ref 5–15)
BUN: 17 mg/dL (ref 8–23)
CO2: 29 mmol/L (ref 22–32)
Calcium: 9.3 mg/dL (ref 8.9–10.3)
Chloride: 98 mmol/L (ref 98–111)
Creatinine: 0.7 mg/dL (ref 0.61–1.24)
GFR, Estimated: >60 mL/min (ref >60)
Glucose, Bld: 103 mg/dL — ABNORMAL HIGH (ref 70–99)
Potassium: 4 mmol/L (ref 3.5–5.1)
Sodium: 133 mmol/L — ABNORMAL LOW (ref 135–145)
Total Bilirubin: 0.4 mg/dL (ref 0.3–1.2)
Total Protein: 6.4 g/dL — ABNORMAL LOW (ref 6.5–8.1)

## 2022-03-26 LAB — TSH: TSH: 0.133 u[IU]/mL — ABNORMAL LOW (ref 0.350–4.500)

## 2022-03-26 MED ORDER — HEPARIN SOD (PORK) LOCK FLUSH 100 UNIT/ML IV SOLN
500.0000 [IU] | Freq: Once | INTRAVENOUS | Status: AC | PRN
  Administered 2022-03-26: 500 [IU]

## 2022-03-26 MED ORDER — SODIUM CHLORIDE 0.9 % IV SOLN
200.0000 mg | Freq: Once | INTRAVENOUS | Status: AC
  Administered 2022-03-26: 200 mg via INTRAVENOUS
  Filled 2022-03-26: qty 200

## 2022-03-26 MED ORDER — SODIUM CHLORIDE 0.9% FLUSH
10.0000 mL | Freq: Once | INTRAVENOUS | Status: AC
  Administered 2022-03-26: 10 mL

## 2022-03-26 MED ORDER — SODIUM CHLORIDE 0.9 % IV SOLN: Freq: Once | INTRAVENOUS | Status: AC

## 2022-03-26 MED ORDER — SODIUM CHLORIDE 0.9% FLUSH
10.0000 mL | INTRAVENOUS | Status: DC | PRN
  Administered 2022-03-26: 10 mL

## 2022-03-26 NOTE — Patient Instructions (Signed)
[facility]   Discharge Instructions: Thank you for choosing [facility] to provide your oncology and hematology care.   If you have a lab appointment with the Cancer Center, please go directly to the Cancer Center and check in at the registration area.   Wear comfortable clothing and clothing appropriate for easy access to any Portacath or PICC line.   We strive to give you quality time with your provider. You may need to reschedule your appointment if you arrive late (15 or more minutes).  Arriving late affects you and other patients whose appointments are after yours.  Also, if you miss three or more appointments without notifying the office, you may be dismissed from the clinic at the provider's discretion.      For prescription refill requests, have your pharmacy contact our office and allow 72 hours for refills to be completed.    Today you received the following chemotherapy and/or immunotherapy agents: Pembrolizumab (Keytruda)      To help prevent nausea and vomiting after your treatment, we encourage you to take your nausea medication as directed.  BELOW ARE SYMPTOMS THAT SHOULD BE REPORTED IMMEDIATELY: *FEVER GREATER THAN 100.4 F (38 C) OR HIGHER *CHILLS OR SWEATING *NAUSEA AND VOMITING THAT IS NOT CONTROLLED WITH YOUR NAUSEA MEDICATION *UNUSUAL SHORTNESS OF BREATH *UNUSUAL BRUISING OR BLEEDING *URINARY PROBLEMS (pain or burning when urinating, or frequent urination) *BOWEL PROBLEMS (unusual diarrhea, constipation, pain near the anus) TENDERNESS IN MOUTH AND THROAT WITH OR WITHOUT PRESENCE OF ULCERS (sore throat, sores in mouth, or a toothache) UNUSUAL RASH, SWELLING OR PAIN  UNUSUAL VAGINAL DISCHARGE OR ITCHING   Items with * indicate a potential emergency and should be followed up as soon as possible or go to the Emergency Department if any problems should occur.  Please show the CHEMOTHERAPY ALERT CARD or IMMUNOTHERAPY ALERT  CARD at check-in to the Emergency Department and triage nurse.  Should you have questions after your visit or need to cancel or reschedule your appointment, please contact [facility]  Dept: [phone]  and follow the prompts.  Office hours are 8:00 a.m. to 4:30 p.m. Monday - Friday. Please note that voicemails left after 4:00 p.m. may not be returned until the following business day.  We are closed weekends and major holidays. You have access to a nurse at all times for urgent questions. Please call the main number to the clinic Dept: [phone] and follow the prompts.   For any non-urgent questions, you may also contact your provider using MyChart. We now offer e-Visits for anyone 18 and older to request care online for non-urgent symptoms. For details visit [email or web address].   Also download the MyChart app! Go to the app store, search "MyChart", open the app, select [facility], and log in with your MyChart username and password.  Masks are optional in the cancer centers. If you would like for your care team to wear a mask while they are taking care of you, please let them know. For doctor visits, patients may have with them one support person who is at least 65 years old. At this time, visitors are not allowed in the infusion area. 

## 2022-03-27 DIAGNOSIS — C679 Malignant neoplasm of bladder, unspecified: Secondary | ICD-10-CM | POA: Diagnosis not present

## 2022-03-29 ENCOUNTER — Inpatient Hospital Stay (HOSPITAL_COMMUNITY)
Admission: EM | Admit: 2022-03-29 | Discharge: 2022-04-01 | DRG: 699 | Disposition: A | Discharge status: Home / Self Care | Payer: 59 | Attending: Internal Medicine | Admitting: Internal Medicine

## 2022-03-29 ENCOUNTER — Emergency Department (HOSPITAL_COMMUNITY): Payer: 59

## 2022-03-29 ENCOUNTER — Telehealth: Payer: Self-pay | Admitting: *Deleted

## 2022-03-29 ENCOUNTER — Encounter (HOSPITAL_COMMUNITY): Payer: Self-pay | Admitting: Emergency Medicine

## 2022-03-29 DIAGNOSIS — Z9221 Personal history of antineoplastic chemotherapy: Secondary | ICD-10-CM

## 2022-03-29 DIAGNOSIS — E871 Hypo-osmolality and hyponatremia: Secondary | ICD-10-CM | POA: Diagnosis present

## 2022-03-29 DIAGNOSIS — B9620 Unspecified Escherichia coli [E. coli] as the cause of diseases classified elsewhere: Secondary | ICD-10-CM | POA: Diagnosis present

## 2022-03-29 DIAGNOSIS — R9431 Abnormal electrocardiogram [ECG] [EKG]: Secondary | ICD-10-CM | POA: Diagnosis not present

## 2022-03-29 DIAGNOSIS — N321 Vesicointestinal fistula: Principal | ICD-10-CM | POA: Diagnosis present

## 2022-03-29 DIAGNOSIS — Z8551 Personal history of malignant neoplasm of bladder: Secondary | ICD-10-CM

## 2022-03-29 DIAGNOSIS — Z66 Do not resuscitate: Secondary | ICD-10-CM | POA: Diagnosis present

## 2022-03-29 DIAGNOSIS — E869 Volume depletion, unspecified: Secondary | ICD-10-CM | POA: Diagnosis present

## 2022-03-29 DIAGNOSIS — Z87820 Personal history of traumatic brain injury: Secondary | ICD-10-CM

## 2022-03-29 DIAGNOSIS — F17210 Nicotine dependence, cigarettes, uncomplicated: Secondary | ICD-10-CM | POA: Diagnosis present

## 2022-03-29 DIAGNOSIS — Z515 Encounter for palliative care: Secondary | ICD-10-CM

## 2022-03-29 DIAGNOSIS — F1021 Alcohol dependence, in remission: Secondary | ICD-10-CM | POA: Diagnosis present

## 2022-03-29 DIAGNOSIS — Z79899 Other long term (current) drug therapy: Secondary | ICD-10-CM

## 2022-03-29 DIAGNOSIS — N390 Urinary tract infection, site not specified: Secondary | ICD-10-CM | POA: Diagnosis present

## 2022-03-29 DIAGNOSIS — E785 Hyperlipidemia, unspecified: Secondary | ICD-10-CM | POA: Diagnosis present

## 2022-03-29 DIAGNOSIS — D630 Anemia in neoplastic disease: Secondary | ICD-10-CM | POA: Diagnosis present

## 2022-03-29 DIAGNOSIS — C786 Secondary malignant neoplasm of retroperitoneum and peritoneum: Secondary | ICD-10-CM | POA: Diagnosis present

## 2022-03-29 DIAGNOSIS — K219 Gastro-esophageal reflux disease without esophagitis: Secondary | ICD-10-CM | POA: Diagnosis present

## 2022-03-29 DIAGNOSIS — C679 Malignant neoplasm of bladder, unspecified: Secondary | ICD-10-CM | POA: Diagnosis present

## 2022-03-29 DIAGNOSIS — Z880 Allergy status to penicillin: Secondary | ICD-10-CM

## 2022-03-29 DIAGNOSIS — I10 Essential (primary) hypertension: Secondary | ICD-10-CM | POA: Diagnosis not present

## 2022-03-29 DIAGNOSIS — Z8547 Personal history of malignant neoplasm of testis: Secondary | ICD-10-CM

## 2022-03-29 DIAGNOSIS — C775 Secondary and unspecified malignant neoplasm of intrapelvic lymph nodes: Secondary | ICD-10-CM | POA: Diagnosis present

## 2022-03-29 DIAGNOSIS — J449 Chronic obstructive pulmonary disease, unspecified: Secondary | ICD-10-CM | POA: Diagnosis present

## 2022-03-29 DIAGNOSIS — R197 Diarrhea, unspecified: Secondary | ICD-10-CM | POA: Diagnosis not present

## 2022-03-29 DIAGNOSIS — Z743 Need for continuous supervision: Secondary | ICD-10-CM | POA: Diagnosis not present

## 2022-03-29 LAB — URINALYSIS, ROUTINE W REFLEX MICROSCOPIC
Bilirubin Urine: NEGATIVE
Glucose, UA: NEGATIVE mg/dL
Ketones, ur: NEGATIVE mg/dL
Nitrite: NEGATIVE
Protein, ur: NEGATIVE mg/dL
Specific Gravity, Urine: 1.003 — ABNORMAL LOW (ref 1.005–1.030)
WBC, UA: >50 WBC/hpf — ABNORMAL HIGH (ref 0–5)
pH: 6 (ref 5.0–8.0)

## 2022-03-29 LAB — CBC WITH DIFFERENTIAL/PLATELET
Abs Immature Granulocytes: 0.03 10*3/uL (ref 0.00–0.07)
Basophils Absolute: 0 10*3/uL (ref 0.0–0.1)
Basophils Relative: 0 %
Eosinophils Absolute: 0 10*3/uL (ref 0.0–0.5)
Eosinophils Relative: 0 %
HCT: 28.8 % — ABNORMAL LOW (ref 39.0–52.0)
Hemoglobin: 9.8 g/dL — ABNORMAL LOW (ref 13.0–17.0)
Immature Granulocytes: 0 %
Lymphocytes Relative: 12 %
Lymphs Abs: 0.9 10*3/uL (ref 0.7–4.0)
MCH: 30.2 pg (ref 26.0–34.0)
MCHC: 34 g/dL (ref 30.0–36.0)
MCV: 88.9 fL (ref 80.0–100.0)
Monocytes Absolute: 0.7 10*3/uL (ref 0.1–1.0)
Monocytes Relative: 10 %
Neutro Abs: 5.6 10*3/uL (ref 1.7–7.7)
Neutrophils Relative %: 78 %
Platelets: 244 10*3/uL (ref 150–400)
RBC: 3.24 MIL/uL — ABNORMAL LOW (ref 4.22–5.81)
RDW: 15.1 % (ref 11.5–15.5)
WBC: 7.3 10*3/uL (ref 4.0–10.5)
nRBC: 0 % (ref 0.0–0.2)

## 2022-03-29 LAB — COMPREHENSIVE METABOLIC PANEL
ALT: 11 U/L (ref 0–44)
AST: 17 U/L (ref 15–41)
Albumin: 3.5 g/dL (ref 3.5–5.0)
Alkaline Phosphatase: 54 U/L (ref 38–126)
Anion gap: 9 (ref 5–15)
BUN: 32 mg/dL — ABNORMAL HIGH (ref 8–23)
CO2: 24 mmol/L (ref 22–32)
Calcium: 8.8 mg/dL — ABNORMAL LOW (ref 8.9–10.3)
Chloride: 92 mmol/L — ABNORMAL LOW (ref 98–111)
Creatinine, Ser: 0.72 mg/dL (ref 0.61–1.24)
GFR, Estimated: >60 mL/min (ref >60)
Glucose, Bld: 95 mg/dL (ref 70–99)
Potassium: 4.5 mmol/L (ref 3.5–5.1)
Sodium: 125 mmol/L — ABNORMAL LOW (ref 135–145)
Total Bilirubin: 0.6 mg/dL (ref 0.3–1.2)
Total Protein: 6.3 g/dL — ABNORMAL LOW (ref 6.5–8.1)

## 2022-03-29 LAB — LACTIC ACID, PLASMA: Lactic Acid, Venous: 0.8 mmol/L (ref 0.5–1.9)

## 2022-03-29 LAB — LIPASE, BLOOD: Lipase: 22 U/L (ref 11–51)

## 2022-03-29 MED ORDER — IOHEXOL 300 MG/ML  SOLN
100.0000 mL | Freq: Once | INTRAMUSCULAR | Status: AC | PRN
  Administered 2022-03-29: 100 mL via INTRAVENOUS

## 2022-03-29 NOTE — ED Provider Notes (Signed)
[facility] DEPT Provider Note   CSN: [id number] Arrival date & time: 03/29/22  2115     History {Add pertinent medical, surgical, social history, OB history to HPI:1} Chief Complaint  Patient presents with   Diarrhea     Cancer Pt    Jonathan Ray is a 66 y.o. male.  The history is provided by the patient and medical records.  Diarrhea  66 year old male with history of bladder cancer, hyperlipidemia, history of testicular cancer, presenting to the ED with diarrhea.  Patient states yesterday he started experiencing a lot of diarrhea, watery in nature.  He denies any frank melena or hematochezia.  He has had a little bit of left lower to mid abdominal pain, but nothing severe.  Reports earlier today he felt like he may have had a fever but did not check temperature.  He denies any vomiting.  Also reports his urine has been "clumpy" with what looks like small pieces of tissue.  States yesterday was completely clear and just liquid.  He did have his first infusion of Keytruda on Friday, 03/26/2022.  States he called oncology clinic today and they encouraged ER evaluation.  Home Medications Prior to Admission medications   Medication Sig Start Date End Date Taking? Authorizing Provider  diazepam (VALIUM) 10 MG tablet Take 10 mg by mouth daily. 12/01/21  Yes [provider]  lidocaine-prilocaine (EMLA) cream Apply 1 application. topically as needed. 01/01/22  Yes [name], MD  Meth-Hyo-M Barnett Hatter Phos-Ph Sal (URIBEL) 118 MG CAPS Take 1 capsule by mouth every 6 (six) hours as needed. 03/14/22  Yes [provider]  oxyCODONE-acetaminophen (PERCOCET/ROXICET) 5-325 MG tablet Take 1 tablet by mouth every 6 (six) hours as needed for severe pain. 02/24/22  Yes Dorie Rank, MD  tamsulosin (FLOMAX) 0.4 MG CAPS capsule Take 0.4 mg by mouth daily. 02/22/22  Yes [provider]      Allergies    Penicillins    Review of Systems   Review of  Systems  Gastrointestinal:  Positive for diarrhea.  All other systems reviewed and are negative.   Physical Exam Updated Vital Signs BP (!) 148/78 (BP Location: Right Arm)   Pulse 88   Temp 98.3 F (36.8 C) (Oral)   Resp 20   Ht '6\' 1"'$  (1.854 m)   Wt 86.2 kg   SpO2 95%   BMI 25.07 kg/m   Physical Exam Vitals and nursing note reviewed.  Constitutional:      Appearance: He is well-developed.  HENT:     Head: Normocephalic and atraumatic.  Eyes:     Conjunctiva/sclera: Conjunctivae normal.     Pupils: Pupils are equal, round, and reactive to light.  Cardiovascular:     Rate and Rhythm: Normal rate and regular rhythm.     Heart sounds: Normal heart sounds.  Pulmonary:     Effort: Pulmonary effort is normal.     Breath sounds: Normal breath sounds.  Abdominal:     General: Bowel sounds are normal.     Palpations: Abdomen is soft.     Tenderness: There is abdominal tenderness.     Comments: Mildly tender left mid/lower abdomen, no peritoneal signs. No rebound or guarding, normal bowel sounds  Musculoskeletal:        General: Normal range of motion.     Cervical back: Normal range of motion.  Skin:    General: Skin is warm and dry.  Neurological:     Mental Status: He  is alert and oriented to person, place, and time.     ED Results / Procedures / Treatments   Labs (all labs ordered are listed, but only abnormal results are displayed) Labs Reviewed  CULTURE, BLOOD (ROUTINE X 2)  CULTURE, BLOOD (ROUTINE X 2)  C DIFFICILE QUICK SCREEN W PCR REFLEX    STOOL CULTURE  URINE CULTURE  COMPREHENSIVE METABOLIC PANEL  LIPASE, BLOOD  CBC WITH DIFFERENTIAL/PLATELET  URINALYSIS, ROUTINE W REFLEX MICROSCOPIC  LACTIC ACID, PLASMA  LACTIC ACID, PLASMA    EKG None  Radiology No results found.  Procedures Procedures  {Document cardiac monitor, telemetry assessment procedure when appropriate:1}  Medications Ordered in ED Medications - No data to display  ED Course/  Medical Decision Making/ A&P                           Medical Decision Making Amount and/or Complexity of Data Reviewed Labs: ordered. Radiology: ordered.   66 year old male with known history of bladder cancer, presenting to the ED with 2 days of diarrhea after receiving first infusion of Keytruda on Friday, 03/26/2022.  Also reports urine has "tissue" like substance present in it, yesterday was mostly clear.  Urine sample at bedside is grossly clumpy, resembles food particles.  Concern for possible development of bowel/bladder fistula or other complication.  Will check labs, cultures, stool studies including C. Diff, CTAP.   Final Clinical Impression(s) / ED Diagnoses Final diagnoses:  None    Rx / DC Orders ED Discharge Orders     None

## 2022-03-29 NOTE — ED Provider Triage Note (Signed)
Emergency Medicine Provider Triage Evaluation Note  Jonathan Ray , a 66 y.o. male  was evaluated in triage.  Pt complains of diarrhea. Hx of bladder cancer, just started [employer] for same. States that he has had 1 day of persistent diarrhea. Denies any hematochezia or melena. Also states that he is having dark colored particles in his urine which is new for him. Denies any fevers, chills, nausea, or vomiting.  Review of Systems  Positive:  Negative:   Physical Exam  BP (!) 148/78 (BP Location: Right Arm)   Pulse 88   Temp 98.3 F (36.8 C) (Oral)   Resp 20   Ht '6\' 1"'$  (1.854 m)   Wt 86.2 kg   SpO2 95%   BMI 25.07 kg/m  Gen:   Awake, no distress   Resp:  Normal effort  MSK:   Moves extremities without difficulty  Other:    Medical Decision Making  Medically screening exam initiated at 9:56 PM.  Appropriate orders placed.  Jonathan Ray was informed that the remainder of the evaluation will be completed by another provider, this initial triage assessment does not replace that evaluation, and the importance of remaining in the ED until their evaluation is complete.     Nestor Lewandowsky 03/29/22 2204

## 2022-03-29 NOTE — ED Notes (Signed)
Pt to CT scan.

## 2022-03-29 NOTE — ED Triage Notes (Signed)
Pt reports diarrhea x1 day. Also reports urinating what seems like "tissue." He has bladder cancer and is on active treatment and states that he just started and immunosuppressant. States that he tried immodium without relief. A&Ox4.

## 2022-03-29 NOTE — Telephone Encounter (Signed)
Pt. states he is having diarrhea and some sediment in his urine after first time Keytruda. He started a new Vitamin C and pre/probiotic the same day as Beryle Flock so he is not sure which medication is causing the diarrhea. States he is taking Imodium and it is helping. Encouraged pt. to stop the new Vitamin, pre/probiotics and continue with Imodium as directed. Instructed pt. to drink 64 oz caffeine free liquids per day, and to call us if he has 4 stools or more per day and/or symptoms persist. Also instructed pt. on Symptom Management clinic if he needs to be seen.

## 2022-03-30 ENCOUNTER — Other Ambulatory Visit: Payer: Self-pay

## 2022-03-30 ENCOUNTER — Encounter (HOSPITAL_COMMUNITY): Payer: Self-pay | Admitting: Internal Medicine

## 2022-03-30 DIAGNOSIS — Z8551 Personal history of malignant neoplasm of bladder: Secondary | ICD-10-CM | POA: Diagnosis not present

## 2022-03-30 DIAGNOSIS — Z8547 Personal history of malignant neoplasm of testis: Secondary | ICD-10-CM | POA: Diagnosis not present

## 2022-03-30 DIAGNOSIS — R69 Illness, unspecified: Secondary | ICD-10-CM | POA: Diagnosis not present

## 2022-03-30 DIAGNOSIS — Z87820 Personal history of traumatic brain injury: Secondary | ICD-10-CM | POA: Diagnosis not present

## 2022-03-30 DIAGNOSIS — Z79899 Other long term (current) drug therapy: Secondary | ICD-10-CM | POA: Diagnosis not present

## 2022-03-30 DIAGNOSIS — K219 Gastro-esophageal reflux disease without esophagitis: Secondary | ICD-10-CM

## 2022-03-30 DIAGNOSIS — Z9221 Personal history of antineoplastic chemotherapy: Secondary | ICD-10-CM | POA: Diagnosis not present

## 2022-03-30 DIAGNOSIS — C679 Malignant neoplasm of bladder, unspecified: Secondary | ICD-10-CM

## 2022-03-30 DIAGNOSIS — F17210 Nicotine dependence, cigarettes, uncomplicated: Secondary | ICD-10-CM | POA: Diagnosis not present

## 2022-03-30 DIAGNOSIS — E869 Volume depletion, unspecified: Secondary | ICD-10-CM | POA: Diagnosis not present

## 2022-03-30 DIAGNOSIS — C671 Malignant neoplasm of dome of bladder: Secondary | ICD-10-CM | POA: Diagnosis not present

## 2022-03-30 DIAGNOSIS — R197 Diarrhea, unspecified: Secondary | ICD-10-CM

## 2022-03-30 DIAGNOSIS — D630 Anemia in neoplastic disease: Secondary | ICD-10-CM | POA: Diagnosis not present

## 2022-03-30 DIAGNOSIS — C6290 Malignant neoplasm of unspecified testis, unspecified whether descended or undescended: Secondary | ICD-10-CM | POA: Diagnosis not present

## 2022-03-30 DIAGNOSIS — C786 Secondary malignant neoplasm of retroperitoneum and peritoneum: Secondary | ICD-10-CM | POA: Diagnosis not present

## 2022-03-30 DIAGNOSIS — J449 Chronic obstructive pulmonary disease, unspecified: Secondary | ICD-10-CM | POA: Diagnosis not present

## 2022-03-30 DIAGNOSIS — B9620 Unspecified Escherichia coli [E. coli] as the cause of diseases classified elsewhere: Secondary | ICD-10-CM | POA: Diagnosis not present

## 2022-03-30 DIAGNOSIS — F1021 Alcohol dependence, in remission: Secondary | ICD-10-CM | POA: Diagnosis present

## 2022-03-30 DIAGNOSIS — Z880 Allergy status to penicillin: Secondary | ICD-10-CM | POA: Diagnosis not present

## 2022-03-30 DIAGNOSIS — N321 Vesicointestinal fistula: Secondary | ICD-10-CM | POA: Diagnosis not present

## 2022-03-30 DIAGNOSIS — Z515 Encounter for palliative care: Secondary | ICD-10-CM | POA: Diagnosis not present

## 2022-03-30 DIAGNOSIS — E871 Hypo-osmolality and hyponatremia: Secondary | ICD-10-CM | POA: Diagnosis not present

## 2022-03-30 DIAGNOSIS — K6389 Other specified diseases of intestine: Secondary | ICD-10-CM | POA: Diagnosis not present

## 2022-03-30 DIAGNOSIS — Z66 Do not resuscitate: Secondary | ICD-10-CM | POA: Diagnosis not present

## 2022-03-30 DIAGNOSIS — C775 Secondary and unspecified malignant neoplasm of intrapelvic lymph nodes: Secondary | ICD-10-CM | POA: Diagnosis not present

## 2022-03-30 DIAGNOSIS — E785 Hyperlipidemia, unspecified: Secondary | ICD-10-CM | POA: Diagnosis not present

## 2022-03-30 DIAGNOSIS — C7911 Secondary malignant neoplasm of bladder: Secondary | ICD-10-CM | POA: Diagnosis not present

## 2022-03-30 DIAGNOSIS — N390 Urinary tract infection, site not specified: Secondary | ICD-10-CM | POA: Diagnosis not present

## 2022-03-30 LAB — COMPREHENSIVE METABOLIC PANEL
ALT: 11 U/L (ref 0–44)
AST: 16 U/L (ref 15–41)
Albumin: 3.4 g/dL — ABNORMAL LOW (ref 3.5–5.0)
Alkaline Phosphatase: 50 U/L (ref 38–126)
Anion gap: 9 (ref 5–15)
BUN: 31 mg/dL — ABNORMAL HIGH (ref 8–23)
CO2: 23 mmol/L (ref 22–32)
Calcium: 8.9 mg/dL (ref 8.9–10.3)
Chloride: 97 mmol/L — ABNORMAL LOW (ref 98–111)
Creatinine, Ser: 0.79 mg/dL (ref 0.61–1.24)
GFR, Estimated: >60 mL/min (ref >60)
Glucose, Bld: 94 mg/dL (ref 70–99)
Potassium: 4.3 mmol/L (ref 3.5–5.1)
Sodium: 129 mmol/L — ABNORMAL LOW (ref 135–145)
Total Bilirubin: 0.5 mg/dL (ref 0.3–1.2)
Total Protein: 6.2 g/dL — ABNORMAL LOW (ref 6.5–8.1)

## 2022-03-30 LAB — BASIC METABOLIC PANEL
Anion gap: 10 (ref 5–15)
Anion gap: 9 (ref 5–15)
BUN: 20 mg/dL (ref 8–23)
BUN: 20 mg/dL (ref 8–23)
CO2: 22 mmol/L (ref 22–32)
CO2: 24 mmol/L (ref 22–32)
Calcium: 9.1 mg/dL (ref 8.9–10.3)
Calcium: 9 mg/dL (ref 8.9–10.3)
Chloride: 100 mmol/L (ref 98–111)
Chloride: 100 mmol/L (ref 98–111)
Creatinine, Ser: 0.84 mg/dL (ref 0.61–1.24)
Creatinine, Ser: 0.72 mg/dL (ref 0.61–1.24)
GFR, Estimated: >60 mL/min (ref >60)
GFR, Estimated: >60 mL/min (ref >60)
Glucose, Bld: 101 mg/dL — ABNORMAL HIGH (ref 70–99)
Glucose, Bld: 112 mg/dL — ABNORMAL HIGH (ref 70–99)
Potassium: 4.1 mmol/L (ref 3.5–5.1)
Potassium: 3.9 mmol/L (ref 3.5–5.1)
Sodium: 132 mmol/L — ABNORMAL LOW (ref 135–145)
Sodium: 133 mmol/L — ABNORMAL LOW (ref 135–145)

## 2022-03-30 LAB — PROTIME-INR
INR: 1 (ref 0.8–1.2)
Prothrombin Time: 13.4 seconds (ref 11.4–15.2)

## 2022-03-30 LAB — CBC WITH DIFFERENTIAL/PLATELET
Abs Immature Granulocytes: 0.02 10*3/uL (ref 0.00–0.07)
Basophils Absolute: 0 10*3/uL (ref 0.0–0.1)
Basophils Relative: 0 %
Eosinophils Absolute: 0 10*3/uL (ref 0.0–0.5)
Eosinophils Relative: 0 %
HCT: 28.3 % — ABNORMAL LOW (ref 39.0–52.0)
Hemoglobin: 9.9 g/dL — ABNORMAL LOW (ref 13.0–17.0)
Immature Granulocytes: 0 %
Lymphocytes Relative: 9 %
Lymphs Abs: 0.6 10*3/uL — ABNORMAL LOW (ref 0.7–4.0)
MCH: 30.6 pg (ref 26.0–34.0)
MCHC: 35 g/dL (ref 30.0–36.0)
MCV: 87.3 fL (ref 80.0–100.0)
Monocytes Absolute: 0.7 10*3/uL (ref 0.1–1.0)
Monocytes Relative: 11 %
Neutro Abs: 5.2 10*3/uL (ref 1.7–7.7)
Neutrophils Relative %: 80 %
Platelets: 215 10*3/uL (ref 150–400)
RBC: 3.24 MIL/uL — ABNORMAL LOW (ref 4.22–5.81)
RDW: 15.3 % (ref 11.5–15.5)
WBC: 6.7 10*3/uL (ref 4.0–10.5)
nRBC: 0 % (ref 0.0–0.2)

## 2022-03-30 LAB — APTT: aPTT: 30 seconds (ref 24–36)

## 2022-03-30 LAB — MAGNESIUM: Magnesium: 1.7 mg/dL (ref 1.7–2.4)

## 2022-03-30 LAB — TYPE AND SCREEN
ABO/RH(D): O POS
Antibody Screen: NEGATIVE

## 2022-03-30 LAB — C DIFFICILE QUICK SCREEN W PCR REFLEX
C Diff antigen: NEGATIVE
C Diff interpretation: NOT DETECTED
C Diff toxin: NEGATIVE

## 2022-03-30 LAB — HIV ANTIBODY (ROUTINE TESTING W REFLEX): HIV Screen 4th Generation wRfx: NONREACTIVE

## 2022-03-30 MED ORDER — SODIUM CHLORIDE 0.9 % IV SOLN
2.0000 g | Freq: Once | INTRAVENOUS | Status: AC
  Administered 2022-03-30: 2 g via INTRAVENOUS
  Filled 2022-03-30: qty 20

## 2022-03-30 MED ORDER — ACETAMINOPHEN 325 MG PO TABS
650.0000 mg | ORAL_TABLET | Freq: Four times a day (QID) | ORAL | Status: DC | PRN
  Administered 2022-03-30: 650 mg via ORAL
  Filled 2022-03-30: qty 2

## 2022-03-30 MED ORDER — ONDANSETRON HCL 4 MG/2ML IJ SOLN: 4.0000 mg | Freq: Four times a day (QID) | INTRAMUSCULAR | Status: DC | PRN

## 2022-03-30 MED ORDER — SODIUM CHLORIDE 0.9 % IV SOLN: INTRAVENOUS | Status: DC

## 2022-03-30 MED ORDER — DIAZEPAM 5 MG PO TABS
10.0000 mg | ORAL_TABLET | Freq: Every day | ORAL | Status: DC
  Administered 2022-03-30 – 2022-03-31 (×2): 10 mg via ORAL
  Filled 2022-03-30 (×3): qty 2

## 2022-03-30 MED ORDER — POLYETHYLENE GLYCOL 3350 17 G PO PACK: 17.0000 g | PACK | Freq: Every day | ORAL | Status: DC | PRN

## 2022-03-30 MED ORDER — SODIUM CHLORIDE 0.9 % IV SOLN
2.0000 g | INTRAVENOUS | Status: DC
  Administered 2022-03-31 – 2022-04-01 (×2): 2 g via INTRAVENOUS
  Filled 2022-03-30 (×2): qty 20

## 2022-03-30 MED ORDER — ONDANSETRON HCL 4 MG PO TABS: 4.0000 mg | ORAL_TABLET | Freq: Four times a day (QID) | ORAL | Status: DC | PRN

## 2022-03-30 MED ORDER — PANTOPRAZOLE SODIUM 40 MG IV SOLR
40.0000 mg | INTRAVENOUS | Status: DC
Start: 2022-03-30 — End: 2022-04-01
  Administered 2022-03-30 – 2022-04-01 (×3): 40 mg via INTRAVENOUS
  Filled 2022-03-30 (×3): qty 10

## 2022-03-30 MED ORDER — ALBUTEROL SULFATE (2.5 MG/3ML) 0.083% IN NEBU: 2.5000 mg | INHALATION_SOLUTION | RESPIRATORY_TRACT | Status: DC | PRN

## 2022-03-30 MED ORDER — LACTATED RINGERS IV SOLN: INTRAVENOUS | Status: DC

## 2022-03-30 MED ORDER — METRONIDAZOLE 500 MG/100ML IV SOLN
500.0000 mg | Freq: Two times a day (BID) | INTRAVENOUS | Status: DC
  Administered 2022-03-30 – 2022-04-01 (×6): 500 mg via INTRAVENOUS
  Filled 2022-03-30 (×6): qty 100

## 2022-03-30 MED ORDER — MORPHINE SULFATE (PF) 2 MG/ML IV SOLN
2.0000 mg | INTRAVENOUS | Status: DC | PRN
  Administered 2022-03-31 – 2022-04-01 (×5): 2 mg via INTRAVENOUS
  Filled 2022-03-30 (×5): qty 1

## 2022-03-30 MED ORDER — ACETAMINOPHEN 650 MG RE SUPP: 650.0000 mg | Freq: Four times a day (QID) | RECTAL | Status: DC | PRN

## 2022-03-30 NOTE — Progress Notes (Signed)
Patient arrived to unit from ED, pt alert and oriented. Patient oriented to unit and call bell.
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
## 2022-03-30 NOTE — Assessment & Plan Note (Signed)
   Empiric intravenous antibiotic treatment for essentially an ongoing urinary tract infection due to the fistula  Antibiotics initiated to cover both gram-negative's and anaerobes  Remainder of assessment and plan as above

## 2022-03-30 NOTE — Progress Notes (Signed)
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  He has been started on IV antibiotics.  Urology and general surgery consultation is pending.  I have seen and examined the patient at bedside and plan of care discussed with him.  I have reviewed patient's medical records including this morning's H&P, current vitals, labs and medications myself.  I have also notified Dr. Alen Blew via secure chat that patient has been hospitalized.  Repeat a.m. labs.
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
## 2022-03-30 NOTE — Progress Notes (Signed)
IP PROGRESS NOTE  Subjective:   Jonathan Ray is known to me with history of advanced bladder cancer that has progressed on cisplatin based chemotherapy and currently receiving immunotherapy for palliative purposes.  He presented with diarrhea as well as passing clumpy urine.  CT scan obtained of the abdomen and pelvis which showed anterior bladder mass and a fistula to the adjacent bowel.  Metastatic disease to the omentum and pelvic adenopathy.  He is currently n.p.o. and feeling reasonably well without any major complaints.  Foley catheter is in place for drainage purposes.  Objective:  Vital signs in last 24 hours: Temp:  [98.1 F (36.7 C)-98.3 F (36.8 C)] 98.1 F (36.7 C) (06/20 0730) Pulse Rate:  [70-92] 80 (06/20 1030) Resp:  [12-29] 18 (06/20 1030) BP: (120-161)/(66-98) 153/78 (06/20 1030) SpO2:  [94 %-100 %] 97 % (06/20 1030) Weight:  [190 lb (86.2 kg)] 190 lb (86.2 kg) (06/19 2123) Weight change:     Intake/Output from previous day: 06/19 0701 - 06/20 0700 In: 180 [IV Piggyback:180] Out: -  General: Alert, awake without distress. Head: Normocephalic atraumatic. Mouth: mucous membranes moist, pharynx normal without lesions Eyes: No scleral icterus.  Pupils are equal and round reactive to light. Resp: clear to auscultation bilaterally without rhonchi or wheezes or dullness to percussion. Cardio: regular rate and rhythm, S1, S2 normal, no murmur, click, rub or gallop GI: soft, non-tender; bowel sounds normal; no masses,  no organomegaly Musculoskeletal: No joint deformity or effusion. Neurological: No motor, sensory deficits.  Intact deep tendon reflexes. Skin: No rashes or lesions.  Portacath without erythema  Lab Results: Recent Labs    03/29/22 2223 03/30/22 0557  WBC 7.3 6.7  HGB 9.8* 9.9*  HCT 28.8* 28.3*  PLT 244 215    BMET Recent Labs    03/29/22 2223 03/30/22 0557  NA 125* 129*  K 4.5 4.3  CL 92* 97*  CO2 24 23  GLUCOSE 95 94  BUN 32* 31*   CREATININE 0.72 0.79  CALCIUM 8.8* 8.9    Studies/Results: CT ABDOMEN PELVIS W CONTRAST  Result Date: 03/30/2022 CLINICAL DATA:  Left lower quadrant abdominal pain and diarrhea. History of bladder cancer. EXAM: CT ABDOMEN AND PELVIS WITH CONTRAST TECHNIQUE: Multidetector CT imaging of the abdomen and pelvis was performed using the standard protocol following bolus administration of intravenous contrast. RADIATION DOSE REDUCTION: This exam was performed according to the departmental dose-optimization program which includes automated exposure control, adjustment of the mA and/or kV according to patient size and/or use of iterative reconstruction technique. CONTRAST:  111m OMNIPAQUE IOHEXOL 300 MG/ML  SOLN COMPARISON:  02/24/2022 FINDINGS: Lower chest: Lung bases are clear. Hepatobiliary: Multiple subcentimeter low-attenuation lesions demonstrated throughout the liver, too small to characterize. No change since prior study. Gallbladder is normal. Mild intrahepatic bile duct dilatation. Pancreas: Subcentimeter cystic lesion in the head of the pancreas without change since prior study. No pancreatic ductal dilatation or inflammatory change. Spleen: Normal in size without focal abnormality. Adrenals/Urinary Tract: No adrenal gland nodules. Kidneys are symmetrical. No hydronephrosis or hydroureter. Asymmetrical thickening of the anterior bladder wall with extension into the surrounding tissues corresponding to known bladder tumor. There appears to be a fistula with adjacent bowel. Tiny amount of air in the bladder. Bladder diverticula posteriorly. Stomach/Bowel: Mildly distended fluid-filled small bowel with transition zone in the right lower quadrant. Colon is decompressed with scattered stool. Rectosigmoid wall is thickened, possibly due to under distention or colitis. Appendix is normal. Vascular/Lymphatic: Calcification of the aorta. No  aneurysm. No significant retroperitoneal lymphadenopathy. Moderately  prominent lymph nodes in the pelvic iliac chains bilaterally are likely metastatic. No change. Reproductive: Prostate gland is enlarged, measuring 5.6 cm in diameter. Other: Numerous peritoneal and omental low-attenuation nodules demonstrated throughout the abdomen. Largest lesions measure up to about 2.9 cm diameter. No change since prior study. Musculoskeletal: No acute or significant osseous findings. IMPRESSION: 1. Anterior bladder mass consistent with known cancer. The mass extends into the surrounding soft tissues with an apparent fistula to adjacent bowel. 2. Fluid-filled mildly dilated small bowel possibly representing obstruction or ileus. The colon is decompressed. Wall thickening of the rectosigmoid colon could be due to under distention or colitis. 3. Multiple omental nodules and pelvic lymphadenopathy consistent with metastatic disease. 4. Mild intrahepatic bile duct dilatation, new since prior study. Cause is indeterminate. 5. Multiple low-attenuation lesions throughout the liver, measuring less than 1 cm. Lesions are too small to characterize but likely represent cysts. No change. 6. Aortic atherosclerosis. 7. Prostate gland is enlarged. 8. Unchanged appearance of 1 cm cystic lesion in the head of the pancreas. Electronically Signed   By: Lucienne Capers M.D.   On: 03/30/2022 00:26    Medications: I have reviewed the patient's current medications.  Assessment/Plan:  66 year old with:  1.  Advanced bladder cancer with disease progression despite cisplatin-based chemotherapy and currently receiving immunotherapy.  Recent imaging studies are not surprising and currently consistent with his disease status.  This was discussed with the patient on a few occasions most recently on March 19, 2022 and we agreed to proceed with different salvage therapy utilizing Pembrolizumab.  The treatment is palliative although his performance status remain excellent and aggressive measures are warranted.  2.   Colovesical fistula noted on his recent imaging studies.  This is related to his bladder tumor and it has progressed despite systemic chemotherapy.  Surgery input is appreciated and likely will be very difficult to surgically repair or divert his bowel at this time given the extent of his metastatic disease.  Conservative management is reasonable given his clinical status.  3.  Prognosis and goals of care: Any treatment for his cancer is palliative.  His disease can be palliated with aggressive anticancer treatment which she is not excellent candidate for so far.  However, if his performance status declines and his disease continued to progress and likely will require hospice in the future.  Please call with any questions regarding this patient.  35  minutes were dedicated to this visit. The time was spent on reviewing laboratory data, imaging studies, discussing treatment options, and answering questions regarding future plan.    LOS: 0 days   Jonathan Ray 03/30/2022, 12:17 PM

## 2022-03-30 NOTE — ED Notes (Signed)
Patient placed on hospital bed.
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
## 2022-03-30 NOTE — Assessment & Plan Note (Signed)
Continuing home regimen of daily PPI therapy.  

## 2022-03-30 NOTE — Assessment & Plan Note (Signed)
   No evidence of COPD exacerbation this time  As needed bronchodilator therapy for episodic shortness of breath and wheezing.  

## 2022-03-30 NOTE — Assessment & Plan Note (Signed)
   Metastatic bladder cancer with spread to omentum and pelvic lymph nodes  Patient follows with Dr. Alen Blew  Recent initiation of Keytruda immunotherapy last Friday as a form of palliative treatment  Overall prognosis is poor  I have added Dr. Alen Blew to the treatment team, appreciate his input on this case if available.

## 2022-03-30 NOTE — Assessment & Plan Note (Signed)
   Evidence of development of fistula in the setting of advancing bladder cancer  This unfortunately is allowing for stool to enter the bladder   Empiric intravenous antibiotics have been started with ceftriaxone and Flagyl  Blood and urine cultures have been obtained  ER provider has already discussed case with Dr. Harlow Asa with general surgery who stated that he will consult on the case and plans to coordinate with Dr. Jeffie Pollock with urology

## 2022-03-30 NOTE — ED Notes (Signed)
Attempted to obtain metabolic panel w/o success. Will have another staff member attempt.

## 2022-03-30 NOTE — Assessment & Plan Note (Signed)
   Acute onset of diarrhea since first dose of Keytruda was administered last Friday  This is likely a medication related side effect  C. difficile testing ordered by emergency department provider is negative  Supportive care otherwise  Hydrating with intravenous isotonic fluids  Attempted to correct associated electrolyte abnormalities such as hyponatremia

## 2022-03-30 NOTE — Assessment & Plan Note (Signed)
   Modest hyponatremia in the setting of volume depletion due to diarrhea over the past 3 days  Hydrating patient with intravenous isotonic fluids  Obtaining serial chemistries every several hours to ensure slow measured correction

## 2022-03-31 DIAGNOSIS — N321 Vesicointestinal fistula: Secondary | ICD-10-CM | POA: Diagnosis not present

## 2022-03-31 LAB — CBC WITH DIFFERENTIAL/PLATELET
Abs Immature Granulocytes: 0.03 10*3/uL (ref 0.00–0.07)
Basophils Absolute: 0 10*3/uL (ref 0.0–0.1)
Basophils Relative: 0 %
Eosinophils Absolute: 0 10*3/uL (ref 0.0–0.5)
Eosinophils Relative: 0 %
HCT: 27.9 % — ABNORMAL LOW (ref 39.0–52.0)
Hemoglobin: 9.4 g/dL — ABNORMAL LOW (ref 13.0–17.0)
Immature Granulocytes: 1 %
Lymphocytes Relative: 19 %
Lymphs Abs: 0.9 10*3/uL (ref 0.7–4.0)
MCH: 30 pg (ref 26.0–34.0)
MCHC: 33.7 g/dL (ref 30.0–36.0)
MCV: 89.1 fL (ref 80.0–100.0)
Monocytes Absolute: 0.8 10*3/uL (ref 0.1–1.0)
Monocytes Relative: 18 %
Neutro Abs: 2.9 10*3/uL (ref 1.7–7.7)
Neutrophils Relative %: 62 %
Platelets: 227 10*3/uL (ref 150–400)
RBC: 3.13 MIL/uL — ABNORMAL LOW (ref 4.22–5.81)
RDW: 15.8 % — ABNORMAL HIGH (ref 11.5–15.5)
WBC: 4.8 10*3/uL (ref 4.0–10.5)
nRBC: 0 % (ref 0.0–0.2)

## 2022-03-31 LAB — GASTROINTESTINAL PANEL BY PCR, STOOL (REPLACES STOOL CULTURE)

## 2022-03-31 LAB — URINE CULTURE: Culture: >=100000 — AB

## 2022-03-31 LAB — COMPREHENSIVE METABOLIC PANEL
ALT: 10 U/L (ref 0–44)
AST: 13 U/L — ABNORMAL LOW (ref 15–41)
Albumin: 3.2 g/dL — ABNORMAL LOW (ref 3.5–5.0)
Alkaline Phosphatase: 48 U/L (ref 38–126)
Anion gap: 9 (ref 5–15)
BUN: 15 mg/dL (ref 8–23)
CO2: 21 mmol/L — ABNORMAL LOW (ref 22–32)
Calcium: 8.7 mg/dL — ABNORMAL LOW (ref 8.9–10.3)
Chloride: 104 mmol/L (ref 98–111)
Creatinine, Ser: 0.66 mg/dL (ref 0.61–1.24)
GFR, Estimated: >60 mL/min (ref >60)
Glucose, Bld: 83 mg/dL (ref 70–99)
Potassium: 3.8 mmol/L (ref 3.5–5.1)
Sodium: 134 mmol/L — ABNORMAL LOW (ref 135–145)
Total Bilirubin: 0.6 mg/dL (ref 0.3–1.2)
Total Protein: 6 g/dL — ABNORMAL LOW (ref 6.5–8.1)

## 2022-03-31 LAB — MAGNESIUM: Magnesium: 1.8 mg/dL (ref 1.7–2.4)

## 2022-03-31 MED ORDER — LIDOCAINE-PRILOCAINE 2.5-2.5 % EX CREA
TOPICAL_CREAM | CUTANEOUS | Status: DC | PRN
Start: 2022-03-31 — End: 2022-04-01
  Filled 2022-03-31: qty 5

## 2022-03-31 MED ORDER — CHLORHEXIDINE GLUCONATE CLOTH 2 % EX PADS
6.0000 | MEDICATED_PAD | Freq: Every day | CUTANEOUS | Status: DC
  Administered 2022-03-31: 6 via TOPICAL

## 2022-03-31 MED ORDER — SODIUM CHLORIDE 0.9% FLUSH
10.0000 mL | INTRAVENOUS | Status: DC | PRN
  Administered 2022-04-01: 10 mL

## 2022-03-31 MED ORDER — SODIUM CHLORIDE 0.9% FLUSH: 10.0000 mL | Freq: Two times a day (BID) | INTRAVENOUS | Status: DC

## 2022-03-31 NOTE — TOC Progression Note (Signed)
Transition of Care [facility]) - Progression Note    Patient Details  Name: Jonathan Ray MRN: [id number] Date of Birth: 07-27-1956  [facility] CM/SW [place], [place] Phone Number: 03/31/2022, 3:01 PM  Clinical Narrative:    Palliative has been consulted for this pt. TOC will continue to follow for recommendations and discharge planning.      Barriers to Discharge: Continued Medical Work up  Expected Discharge Plan and Services   In-house Referral: Hospice / Palliative Care                                             Social Determinants of Health (SDOH) Interventions    Readmission Risk Interventions     No data to display

## 2022-03-31 NOTE — Progress Notes (Signed)
Patient has had two episodes of urgency feelings to use the bathroom. While sitting on the commode patient reports strong pain in groin, shortness of breath, and flushed feeling like he has felt before before passing out. Patient BP checked after first episode and is normotensive. After second episode patient informed he needs to ask for assistance to the bathroom so he can be observed to help reduce risk of falls.

## 2022-03-31 NOTE — Progress Notes (Signed)
Discussed penicillin allergy history with patient. He does not remember this childhood allergy and does not report ever having to be hospitalized for an allergic reaction at any point. He also reports tolerating amoxicillin in the past and is very open to an amoxicillin PO challenge. Discussed this with MD. Will continue to follow and de-label allergy if able.   Donald Pore, PharmD Pharmacy Resident 03/31/2022, 2:07 PM

## 2022-03-31 NOTE — Progress Notes (Signed)
IP PROGRESS NOTE  Subjective:   Jonathan Ray reports no changes. Foley catheter in place with cloudy urine noted.  He denies any increased pain or discomfort at this time.  Objective:  Vital signs in last 24 hours: Temp:  [97.7 F (36.5 C)-98.7 F (37.1 C)] 98 F (36.7 C) (06/21 1403) Pulse Rate:  [69-106] 106 (06/21 1403) Resp:  [14-22] 18 (06/21 1403) BP: (123-158)/(67-91) 126/77 (06/21 1403) SpO2:  [97 %-100 %] 99 % (06/21 1403) Weight:  [190 lb 4.1 oz (86.3 kg)] 190 lb 4.1 oz (86.3 kg) (06/21 0616) Weight change: 4.1 oz (0.117 kg) Last BM Date : 03/30/22  Intake/Output from previous day: 06/20 0701 - 06/21 0700 In: 2376.3 [I.V.:2076.3; IV Piggyback:300] Out: -   General appearance: Comfortable appearing without any discomfort Head: Normocephalic without any trauma Oropharynx: Mucous membranes are moist and pink without any thrush or ulcers. Eyes: Pupils are equal and round reactive to light. Lymph nodes: No cervical, supraclavicular, inguinal or axillary lymphadenopathy.   Heart:regular rate and rhythm.  S1 and S2 without leg edema. Lung: Clear without any rhonchi or wheezes.  No dullness to percussion. Abdomin: Soft, nontender, nondistended with good bowel sounds.  No hepatosplenomegaly. Musculoskeletal: No joint deformity or effusion.  Full range of motion noted. Neurological: No deficits noted on motor, sensory and deep tendon reflex exam.    Lab Results: Recent Labs    03/30/22 0557 03/31/22 0629  WBC 6.7 4.8  HGB 9.9* 9.4*  HCT 28.3* 27.9*  PLT 215 227    BMET Recent Labs    03/30/22 1853 03/31/22 0629  NA 133* 134*  K 3.9 3.8  CL 100 104  CO2 24 21*  GLUCOSE 112* 83  BUN 20 15  CREATININE 0.72 0.66  CALCIUM 9.0 8.7*    Studies/Results: CT ABDOMEN PELVIS W CONTRAST  Result Date: 03/30/2022 CLINICAL DATA:  Left lower quadrant abdominal pain and diarrhea. History of bladder cancer. EXAM: CT ABDOMEN AND PELVIS WITH CONTRAST TECHNIQUE:  Multidetector CT imaging of the abdomen and pelvis was performed using the standard protocol following bolus administration of intravenous contrast. RADIATION DOSE REDUCTION: This exam was performed according to the departmental dose-optimization program which includes automated exposure control, adjustment of the mA and/or kV according to patient size and/or use of iterative reconstruction technique. CONTRAST:  176m OMNIPAQUE IOHEXOL 300 MG/ML  SOLN COMPARISON:  02/24/2022 FINDINGS: Lower chest: Lung bases are clear. Hepatobiliary: Multiple subcentimeter low-attenuation lesions demonstrated throughout the liver, too small to characterize. No change since prior study. Gallbladder is normal. Mild intrahepatic bile duct dilatation. Pancreas: Subcentimeter cystic lesion in the head of the pancreas without change since prior study. No pancreatic ductal dilatation or inflammatory change. Spleen: Normal in size without focal abnormality. Adrenals/Urinary Tract: No adrenal gland nodules. Kidneys are symmetrical. No hydronephrosis or hydroureter. Asymmetrical thickening of the anterior bladder wall with extension into the surrounding tissues corresponding to known bladder tumor. There appears to be a fistula with adjacent bowel. Tiny amount of air in the bladder. Bladder diverticula posteriorly. Stomach/Bowel: Mildly distended fluid-filled small bowel with transition zone in the right lower quadrant. Colon is decompressed with scattered stool. Rectosigmoid wall is thickened, possibly due to under distention or colitis. Appendix is normal. Vascular/Lymphatic: Calcification of the aorta. No aneurysm. No significant retroperitoneal lymphadenopathy. Moderately prominent lymph nodes in the pelvic iliac chains bilaterally are likely metastatic. No change. Reproductive: Prostate gland is enlarged, measuring 5.6 cm in diameter. Other: Numerous peritoneal and omental low-attenuation nodules demonstrated throughout the abdomen.  Largest lesions measure up to about 2.9 cm diameter. No change since prior study. Musculoskeletal: No acute or significant osseous findings. IMPRESSION: 1. Anterior bladder mass consistent with known cancer. The mass extends into the surrounding soft tissues with an apparent fistula to adjacent bowel. 2. Fluid-filled mildly dilated small bowel possibly representing obstruction or ileus. The colon is decompressed. Wall thickening of the rectosigmoid colon could be due to under distention or colitis. 3. Multiple omental nodules and pelvic lymphadenopathy consistent with metastatic disease. 4. Mild intrahepatic bile duct dilatation, new since prior study. Cause is indeterminate. 5. Multiple low-attenuation lesions throughout the liver, measuring less than 1 cm. Lesions are too small to characterize but likely represent cysts. No change. 6. Aortic atherosclerosis. 7. Prostate gland is enlarged. 8. Unchanged appearance of 1 cm cystic lesion in the head of the pancreas. Electronically Signed   By: Lucienne Capers M.D.   On: 03/30/2022 00:26    Medications: I have reviewed the patient's current medications.  Assessment/Plan:  66 year old with:  1. Bladder cancer with documented metastatic disease including intra-abdominal cavity carcinomatosis.    This was discussed again with the patient and his wife.  His prognosis was reiterated he understands he is a poor life expectancy even with the best of treatment choices.  He is currently on immunotherapy and its unclear whether will help or not.  We will consider stopping it versus resuming it in the future.  2.  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  Not a surgical candidate at this time.  Supportive management is recommended.    3.  Prognosis and goals of care: Overall poor with limited life expectancy.  His performance status is reasonable for elected to continue with anticancer treatment.  He would benefit from palliative care services.  If he  opted against any additional treatment then transitioning to hospice would be appropriate.    35  minutes were spent on this encounter.  The time was dedicated to updating his disease status, discussing diagnosis, prognosis and future plan of care discussion with the patient and his wife.   LOS: 1 day   Zola Button 03/31/2022, 2:46 PM

## 2022-03-31 NOTE — Progress Notes (Signed)
PROGRESS NOTE    Jonathan Ray  MWU:[id number] DOB: [birth year] DOA: 03/29/2022 PCP: [name], DO     Brief Narrative:   H/o advanced bladder cancer that has progressed on cisplatin based chemotherapy and recently started immunotherapy for palliative purposes,  He presented with diarrhea as well as passing clumpy urine.  CT scan obtained of the abdomen and pelvis which showed anterior bladder mass and a fistula to the adjacent bowel.  Metastatic disease to the omentum and pelvic adenopathy  Subjective:  Has a large bore foley placed this am,  Wife at bedside   Assessment & Plan:  Principal Problem:   Vesicocolonic fistula Active Problems:   Complicated UTI (urinary tract infection)   Acute diarrhea   COPD (chronic obstructive pulmonary disease) (HCC)   GERD without esophagitis   Hyponatremia   Malignant tumor of urinary bladder (HCC)    Assessment and Plan:  * Vesicocolonic fistula/complicated uti/advanced bladder cancer -Seen by general surgery not felt to be a candidate for surgery, urology Dr Jeffie Pollock recommended large bore Foley catheter, if patient cannot tolerate Foley catheter will need to consider bilateral nephrostomy tube -Large bore Foley catheter placed on 6/21, drainage consistent with stool consistency -Empiric intravenous antibiotics with ceftriaxone and Flagyl started on admission  -Blood culture no growth,  urine cultures + pan sensitive ecoli -seen by oncology Dr Alen Blew who recommend palliative care consulted for goals of care discussion   Acute diarrhea Acute onset of diarrhea since first dose of Keytruda was administered last Friday This is likely a medication related side effect C. difficile testing   negative, gi prc panel negative , stool culture in process Supportive care otherwise Hydrating with intravenous isotonic fluids  Hyponatremia Likely from GI loss due to diarrhea Sodium 125 on presentation Sodium improving on IV  hydration  Normocytic anemia, likely anemia of chronic disease Currently no overt sign of external bleeding Monitor hemoglobin  COPD (chronic obstructive pulmonary disease) (HCC) No evidence of COPD exacerbation this time As needed bronchodilator therapy for episodic shortness of breath and wheezing.   GERD without esophagitis Continuing home regimen of daily PPI therapy.      I have Reviewed nursing notes, Vitals, pain scores, I/o's, Lab results and  imaging results since pt's last encounter, details please see discussion above  I ordered the following labs:  Unresulted Labs (From admission, onward)     Start     Ordered   04/01/22 7253  Basic metabolic panel  Tomorrow morning,   R        03/31/22 1743   04/01/22 0500  Magnesium  Tomorrow morning,   R        03/31/22 1743   04/01/22 0500  CBC  Tomorrow morning,   R        03/31/22 1743   03/30/22 0245  Stool culture  Once,   R        03/30/22 0245   03/29/22 2214  Stool culture  Once,   URGENT        03/29/22 2214             DVT prophylaxis: SCDs Start: 03/30/22 0557   Code Status:   Code Status: Full Code  Family Communication: Wife at bedside Disposition:    Dispo: The patient is from: Home              Anticipated d/c is to: Home              Anticipated d/c  date is: TBD, may need bilateral nephrostomy tube if not able to tolerate large bore Foley catheter, need palliative care for goals of care discussion  Antimicrobials:   Anti-infectives (From admission, onward)    Start     Dose/Rate Route Frequency Ordered Stop   03/31/22 0200  cefTRIAXone (ROCEPHIN) 2 g in sodium chloride 0.9 % 100 mL IVPB        2 g 200 mL/hr over 30 Minutes Intravenous Every 24 hours 03/30/22 0558     03/30/22 0130  cefTRIAXone (ROCEPHIN) 2 g in sodium chloride 0.9 % 100 mL IVPB        2 g 200 mL/hr over 30 Minutes Intravenous  Once 03/30/22 0129 03/30/22 0205   03/30/22 0130  metroNIDAZOLE (FLAGYL) IVPB 500 mg        500  mg 100 mL/hr over 60 Minutes Intravenous Every 12 hours 03/30/22 0129             Objective: Vitals:   03/30/22 2115 03/31/22 0106 03/31/22 0616 03/31/22 1403  BP: 131/73 (!) 143/83 123/67 126/77  Pulse: 84 81 69 (!) 106  Resp: '20 19 14 18  '$ Temp: 98.5 F (36.9 C) 98.7 F (37.1 C) 97.7 F (36.5 C) 98 F (36.7 C)  TempSrc:   Oral Oral  SpO2: 100% 98% 99% 99%  Weight:   86.3 kg   Height:        Intake/Output Summary (Last 24 hours) at 03/31/2022 1743 Last data filed at 03/31/2022 1600 Gross per 24 hour  Intake 2013.38 ml  Output 2000 ml  Net 13.38 ml   Filed Weights   03/29/22 2123 03/31/22 0616  Weight: 86.2 kg 86.3 kg    Examination:  General exam: weak, chronically ill appearing, + foley with dark drainage  Respiratory system: Clear to auscultation. Respiratory effort normal. Cardiovascular system:  RRR.  Gastrointestinal system: Abdomen is nondistended, soft and nontender.  Normal bowel sounds heard. Central nervous system: Alert and oriented. No focal neurological deficits. Extremities:  no edema Skin: No rashes, lesions or ulcers Psychiatry: Judgement and insight appear normal. Mood & affect appropriate.     Data Reviewed: I have personally reviewed  labs and visualized  imaging studies since the last encounter and formulate the plan        Scheduled Meds:  Chlorhexidine Gluconate Cloth  6 each Topical Daily   diazepam  10 mg Oral Daily   pantoprazole (PROTONIX) IV  40 mg Intravenous Q24H   sodium chloride flush  10-40 mL Intracatheter Q12H   Continuous Infusions:  sodium chloride 100 mL/hr at 03/31/22 0447   cefTRIAXone (ROCEPHIN)  IV Stopped (03/31/22 0303)   metronidazole 500 mg (03/31/22 1406)     LOS: 1 day      Florencia Reasons, MD PhD FACP [facility]  Available via Epic secure chat 7am-7pm for nonurgent issues Please page for urgent issues To page the attending provider between 7A-7P or the covering provider during after hours  7P-7A, please log into the web site [email or web address] and access using universal [place] password for that web site. If you do not have the password, please call the hospital operator.    03/31/2022, 5:43 PM

## 2022-03-31 NOTE — Progress Notes (Signed)
Subjective: CC: Stable mid abdominal pain. No n/v. Still having diarrhea. Voiding with dysuria and passing clumpy material. No foley. Spoke with onc yesterday.   Objective: Vital signs in last 24 hours: Temp:  [97.7 F (36.5 C)-98.7 F (37.1 C)] 97.7 F (36.5 C) (06/21 0616) Pulse Rate:  [69-91] 69 (06/21 0616) Resp:  [14-22] 14 (06/21 0616) BP: (118-158)/(58-91) 123/67 (06/21 0616) SpO2:  [97 %-100 %] 99 % (06/21 0616) Weight:  [86.3 kg] 86.3 kg (06/21 0616) Last BM Date : 03/30/22  Intake/Output from previous day: 06/20 0701 - 06/21 0700 In: 2376.3 [I.V.:2076.3; IV Piggyback:300] Out: -  Intake/Output this shift: No intake/output data recorded.  PE: Gen:  Alert, NAD, pleasant Abd: Soft, ND, some central ttp without peritonitis, +BS Psych: A&Ox3   Lab Results:  Recent Labs    03/30/22 0557 03/31/22 0629  WBC 6.7 4.8  HGB 9.9* 9.4*  HCT 28.3* 27.9*  PLT 215 227   BMET Recent Labs    03/30/22 1853 03/31/22 0629  NA 133* 134*  K 3.9 3.8  CL 100 104  CO2 24 21*  GLUCOSE 112* 83  BUN 20 15  CREATININE 0.72 0.66  CALCIUM 9.0 8.7*   PT/INR Recent Labs    03/30/22 0557  LABPROT 13.4  INR 1.0   CMP     Component Value Date/Time   NA 134 (L) 03/31/2022 0629   NA 143 02/10/2017 1111   K 3.8 03/31/2022 0629   CL 104 03/31/2022 0629   CO2 21 (L) 03/31/2022 0629   GLUCOSE 83 03/31/2022 0629   BUN 15 03/31/2022 0629   BUN 8 02/10/2017 1111   CREATININE 0.66 03/31/2022 0629   CREATININE 0.70 03/26/2022 1206   CALCIUM 8.7 (L) 03/31/2022 0629   PROT 6.0 (L) 03/31/2022 0629   PROT 6.6 02/10/2017 1111   ALBUMIN 3.2 (L) 03/31/2022 0629   ALBUMIN 4.4 02/10/2017 1111   AST 13 (L) 03/31/2022 0629   AST 10 (L) 03/26/2022 1206   ALT 10 03/31/2022 0629   ALT 5 03/26/2022 1206   ALKPHOS 48 03/31/2022 0629   BILITOT 0.6 03/31/2022 0629   BILITOT 0.4 03/26/2022 1206   GFRNONAA >60 03/31/2022 0629   GFRNONAA >60 03/26/2022 1206   GFRAA >60 04/10/2020  0528   Lipase     Component Value Date/Time   LIPASE 22 03/29/2022 2223    Studies/Results: CT ABDOMEN PELVIS W CONTRAST  Result Date: 03/30/2022 CLINICAL DATA:  Left lower quadrant abdominal pain and diarrhea. History of bladder cancer. EXAM: CT ABDOMEN AND PELVIS WITH CONTRAST TECHNIQUE: Multidetector CT imaging of the abdomen and pelvis was performed using the standard protocol following bolus administration of intravenous contrast. RADIATION DOSE REDUCTION: This exam was performed according to the departmental dose-optimization program which includes automated exposure control, adjustment of the mA and/or kV according to patient size and/or use of iterative reconstruction technique. CONTRAST:  116m OMNIPAQUE IOHEXOL 300 MG/ML  SOLN COMPARISON:  02/24/2022 FINDINGS: Lower chest: Lung bases are clear. Hepatobiliary: Multiple subcentimeter low-attenuation lesions demonstrated throughout the liver, too small to characterize. No change since prior study. Gallbladder is normal. Mild intrahepatic bile duct dilatation. Pancreas: Subcentimeter cystic lesion in the head of the pancreas without change since prior study. No pancreatic ductal dilatation or inflammatory change. Spleen: Normal in size without focal abnormality. Adrenals/Urinary Tract: No adrenal gland nodules. Kidneys are symmetrical. No hydronephrosis or hydroureter. Asymmetrical thickening of the anterior bladder wall with extension into the surrounding tissues corresponding  to known bladder tumor. There appears to be a fistula with adjacent bowel. Tiny amount of air in the bladder. Bladder diverticula posteriorly. Stomach/Bowel: Mildly distended fluid-filled small bowel with transition zone in the right lower quadrant. Colon is decompressed with scattered stool. Rectosigmoid wall is thickened, possibly due to under distention or colitis. Appendix is normal. Vascular/Lymphatic: Calcification of the aorta. No aneurysm. No significant  retroperitoneal lymphadenopathy. Moderately prominent lymph nodes in the pelvic iliac chains bilaterally are likely metastatic. No change. Reproductive: Prostate gland is enlarged, measuring 5.6 cm in diameter. Other: Numerous peritoneal and omental low-attenuation nodules demonstrated throughout the abdomen. Largest lesions measure up to about 2.9 cm diameter. No change since prior study. Musculoskeletal: No acute or significant osseous findings. IMPRESSION: 1. Anterior bladder mass consistent with known cancer. The mass extends into the surrounding soft tissues with an apparent fistula to adjacent bowel. 2. Fluid-filled mildly dilated small bowel possibly representing obstruction or ileus. The colon is decompressed. Wall thickening of the rectosigmoid colon could be due to under distention or colitis. 3. Multiple omental nodules and pelvic lymphadenopathy consistent with metastatic disease. 4. Mild intrahepatic bile duct dilatation, new since prior study. Cause is indeterminate. 5. Multiple low-attenuation lesions throughout the liver, measuring less than 1 cm. Lesions are too small to characterize but likely represent cysts. No change. 6. Aortic atherosclerosis. 7. Prostate gland is enlarged. 8. Unchanged appearance of 1 cm cystic lesion in the head of the pancreas. Electronically Signed   By: Lucienne Capers M.D.   On: 03/30/2022 00:26    Anti-infectives: Anti-infectives (From admission, onward)    Start     Dose/Rate Route Frequency Ordered Stop   03/31/22 0200  cefTRIAXone (ROCEPHIN) 2 g in sodium chloride 0.9 % 100 mL IVPB        2 g 200 mL/hr over 30 Minutes Intravenous Every 24 hours 03/30/22 0558     03/30/22 0130  cefTRIAXone (ROCEPHIN) 2 g in sodium chloride 0.9 % 100 mL IVPB        2 g 200 mL/hr over 30 Minutes Intravenous  Once 03/30/22 0129 03/30/22 0205   03/30/22 0130  metroNIDAZOLE (FLAGYL) IVPB 500 mg        500 mg 100 mL/hr over 60 Minutes Intravenous Every 12 hours 03/30/22 0129           Assessment/Plan Metastatic bladder cancer Colovesical fistula  - CT with known bladder mass and fistula to adjacent bowel, possible mild ileus, omental nodules and pelvic lymphadenopathy, multiple lesions in liver, enlarged prostate, cystic lesion in head of pancreas - No leukocytosis, afebrile, afebrile and HD stable - Feculent appearing urine per patient Unfortunately, patient has carcinomatosis and significant disease in his abdomen.  You really can't divert the small bowel due to having high output issues, but also given his disease, this would be high unlikely to be accomplished given scarring and everything being stuck down and unable to be safely freed up.  No acute surgical intervention able to be offered at this time.  Agree with urology for foley placement to help drain urine to minimize reflux back into bowels to help decrease his diarrhea.  Onc has seen and agrees with conservative management for this.  We will sign off.  Please call back with any questions or concerns.   FEN: May have a diet from our standpoint, IVF per [facility] VTE: Okay for chemical prophylaxis from a general surgery standpoint. ID: [email or web address]. UCx + for E. Coli   - below per [facility] -  COPD GERD Hx of testicular CA s/p left orchiectomy  Hx of EtOH abuse in remission   LOS: 1 day    Jillyn Ledger , [address] [facility] Surgery 03/31/2022, 9:09 AM Please see Amion for pager number during day hours 7:00am-4:30pm

## 2022-03-31 NOTE — Progress Notes (Signed)
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 Alen Blew  03/2022 - CT - CT with some increase in bnladder dome + peritoneal disease burden, in midst of chemo. Cr 0.79, LFT normal, change to palliative immune therapy.   2 - Testis Cancer - s/p left orchiectomy and chemo previously, H/o bilateral undesended testis.   3 - Enterovesical Fistula- new radiographic and clinical enterovesical fistula by ER CT and fecaluria 03/2022. Large  bore foley placed with significant symptom improvement. NOT operative candidate as active metastatic abd maliganncy.   4 - Goals Of Care / End of Life Planning  - Pt with guarded prognosis from progressive metastatic bladder cancer. He is not ready for comfort care / hospice transition.   PMH otherwise unremarkeble. NO CV disease / blood thinners. Retired from trucking (some long haul, some local) .His wife Jonathan Ray is very involved. His PCP is Jonathan Morning DO.   Today "Jonathan Ray" is stable. Foley working well. No fevers. Dr. Alen Blew with medical oncology has extensively counseled Jonathan Ray in regards to poor prognosis.   Objective: Vital signs in last 24 hours: Temp:  [97.7 F (36.5 C)-98.7 F (37.1 C)] 98 F (36.7 C) (06/21 1403) Pulse Rate:  [69-106] 106 (06/21 1403) Resp:  [14-20] 18 (06/21 1403) BP: (123-143)/(67-83) 126/77 (06/21 1403) SpO2:  [98 %-100 %] 99 % (06/21 1403) Weight:  [86.3 kg] 86.3 kg (06/21 0616) Last BM Date : 03/31/22  Intake/Output from previous day: 06/20 0701 - 06/21 0700 In: 2376.3 [I.V.:2076.3; IV Piggyback:300] Out: -  Intake/Output this shift: Total I/O In: 394.5 [P.O.:240; Other:110; IV Piggyback:44.5] Out: 2450 [Urine:2450]  EXAM: NAD, AOx3, dry humor as per baseline Non-labored breathing on  RA RRR SNTND, prior scars w/o hernas, no r/g. Large bore catheter in place with cloudy urine, no frank feces at this time, but likely some enteric contents present (?small bowel). NO c/c/e  Lab Results:  Recent Labs    03/30/22 0557 03/31/22 0629  WBC 6.7 4.8  HGB 9.9* 9.4*  HCT 28.3* 27.9*  PLT 215 227   BMET Recent Labs    03/30/22 1853 03/31/22 0629  NA 133* 134*  K 3.9 3.8  CL 100 104  CO2 24 21*  GLUCOSE 112* 83  BUN 20 15  CREATININE 0.72 0.66  CALCIUM 9.0 8.7*   PT/INR Recent Labs    03/30/22 0557  LABPROT 13.4  INR 1.0   ABG No results for input(s): "PHART", "HCO3" in the last 72 hours.  Invalid input(s): "PCO2", "PO2"  Studies/Results: CT ABDOMEN PELVIS W CONTRAST  Result Date: 03/30/2022 CLINICAL DATA:  Left lower quadrant abdominal pain and diarrhea. History of bladder cancer. EXAM: CT ABDOMEN AND PELVIS WITH CONTRAST TECHNIQUE: Multidetector CT imaging of the abdomen and pelvis was performed using the standard protocol following bolus administration of intravenous contrast. RADIATION DOSE REDUCTION: This exam was performed according to the departmental dose-optimization program which includes automated exposure control, adjustment of the mA and/or kV according to patient size and/or use of iterative reconstruction technique. CONTRAST:  120m OMNIPAQUE IOHEXOL 300 MG/ML  SOLN COMPARISON:  02/24/2022 FINDINGS: Lower chest: Lung bases are clear. Hepatobiliary: Multiple subcentimeter low-attenuation lesions demonstrated throughout the liver, too small to characterize. No change since prior study. Gallbladder is normal. Mild intrahepatic  bile duct dilatation. Pancreas: Subcentimeter cystic lesion in the head of the pancreas without change since prior study. No pancreatic ductal dilatation or inflammatory change. Spleen: Normal in size without focal abnormality. Adrenals/Urinary Tract: No adrenal gland nodules. Kidneys are symmetrical. No hydronephrosis or  hydroureter. Asymmetrical thickening of the anterior bladder wall with extension into the surrounding tissues corresponding to known bladder tumor. There appears to be a fistula with adjacent bowel. Tiny amount of air in the bladder. Bladder diverticula posteriorly. Stomach/Bowel: Mildly distended fluid-filled small bowel with transition zone in the right lower quadrant. Colon is decompressed with scattered stool. Rectosigmoid wall is thickened, possibly due to under distention or colitis. Appendix is normal. Vascular/Lymphatic: Calcification of the aorta. No aneurysm. No significant retroperitoneal lymphadenopathy. Moderately prominent lymph nodes in the pelvic iliac chains bilaterally are likely metastatic. No change. Reproductive: Prostate gland is enlarged, measuring 5.6 cm in diameter. Other: Numerous peritoneal and omental low-attenuation nodules demonstrated throughout the abdomen. Largest lesions measure up to about 2.9 cm diameter. No change since prior study. Musculoskeletal: No acute or significant osseous findings. IMPRESSION: 1. Anterior bladder mass consistent with known cancer. The mass extends into the surrounding soft tissues with an apparent fistula to adjacent bowel. 2. Fluid-filled mildly dilated small bowel possibly representing obstruction or ileus. The colon is decompressed. Wall thickening of the rectosigmoid colon could be due to under distention or colitis. 3. Multiple omental nodules and pelvic lymphadenopathy consistent with metastatic disease. 4. Mild intrahepatic bile duct dilatation, new since prior study. Cause is indeterminate. 5. Multiple low-attenuation lesions throughout the liver, measuring less than 1 cm. Lesions are too small to characterize but likely represent cysts. No change. 6. Aortic atherosclerosis. 7. Prostate gland is enlarged. 8. Unchanged appearance of 1 cm cystic lesion in the head of the pancreas. Electronically Signed   By: Lucienne Capers M.D.   On: 03/30/2022  00:26    Anti-infectives: Anti-infectives (From admission, onward)    Start     Dose/Rate Route Frequency Ordered Stop   03/31/22 0200  cefTRIAXone (ROCEPHIN) 2 g in sodium chloride 0.9 % 100 mL IVPB        2 g 200 mL/hr over 30 Minutes Intravenous Every 24 hours 03/30/22 0558     03/30/22 0130  cefTRIAXone (ROCEPHIN) 2 g in sodium chloride 0.9 % 100 mL IVPB        2 g 200 mL/hr over 30 Minutes Intravenous  Once 03/30/22 0129 03/30/22 0205   03/30/22 0130  metroNIDAZOLE (FLAGYL) IVPB 500 mg        500 mg 100 mL/hr over 60 Minutes Intravenous Every 12 hours 03/30/22 0129         Assessment/Plan:  Discussed with pt and wife that I am concerned cancer is progressing, though too early to tell if meaningful respone to immune therapy. New fistula may actually represent some tumor necrosis opening bowel connection. Poor prognosis frankly discussed. He is not ready for comfort care / palliative transition yet, but amenable if no meaningful response to immune therapy. I told him I felt it was a "hail Stanton Kidney" but reasonable to try given his goals. Greaty appreciate Dr. Hazeline Junker comangement and couseling.  No role for any GU or GI surgery at this point, would reconsdier in future if dramatic response to systemic therapy, which seems unlikely.  COntinue foley for now and at discharge. We will reqeust cath change in our office in about 2-3 weeks and monthly as of now.   Please call me directly with  questions anytime.    Alexis Frock 03/31/2022

## 2022-04-01 DIAGNOSIS — N321 Vesicointestinal fistula: Secondary | ICD-10-CM | POA: Diagnosis not present

## 2022-04-01 LAB — BASIC METABOLIC PANEL WITH GFR
Anion gap: 8 (ref 5–15)
BUN: 10 mg/dL (ref 8–23)
CO2: 26 mmol/L (ref 22–32)
Calcium: 9.1 mg/dL (ref 8.9–10.3)
Chloride: 104 mmol/L (ref 98–111)
Creatinine, Ser: 0.7 mg/dL (ref 0.61–1.24)
GFR, Estimated: >60 mL/min (ref >60)
Glucose, Bld: 108 mg/dL — ABNORMAL HIGH (ref 70–99)
Potassium: 3.3 mmol/L — ABNORMAL LOW (ref 3.5–5.1)
Sodium: 138 mmol/L (ref 135–145)

## 2022-04-01 LAB — MAGNESIUM: Magnesium: 1.7 mg/dL (ref 1.7–2.4)

## 2022-04-01 LAB — CBC
HCT: 29.1 % — ABNORMAL LOW (ref 39.0–52.0)
Hemoglobin: 9.6 g/dL — ABNORMAL LOW (ref 13.0–17.0)
MCH: 29.7 pg (ref 26.0–34.0)
MCHC: 33 g/dL (ref 30.0–36.0)
MCV: 90.1 fL (ref 80.0–100.0)
Platelets: 225 10*3/uL (ref 150–400)
RBC: 3.23 MIL/uL — ABNORMAL LOW (ref 4.22–5.81)
RDW: 15.7 % — ABNORMAL HIGH (ref 11.5–15.5)
WBC: 5.5 10*3/uL (ref 4.0–10.5)
nRBC: 0 % (ref 0.0–0.2)

## 2022-04-01 MED ORDER — DIAZEPAM 5 MG PO TABS
10.0000 mg | ORAL_TABLET | Freq: Once | ORAL | Status: AC
Start: 2022-04-01 — End: 2022-04-01
  Administered 2022-04-01: 10 mg via ORAL
  Filled 2022-04-01: qty 2

## 2022-04-01 MED ORDER — DIAZEPAM 10 MG PO TABS: 10.0000 mg | ORAL_TABLET | Freq: Two times a day (BID) | ORAL | 0 refills | Status: AC | PRN

## 2022-04-01 MED ORDER — HEPARIN SOD (PORK) LOCK FLUSH 100 UNIT/ML IV SOLN
500.0000 [IU] | INTRAVENOUS | Status: AC | PRN
  Administered 2022-04-01: 500 [IU]

## 2022-04-01 MED ORDER — CEPHALEXIN 500 MG PO CAPS: 500.0000 mg | ORAL_CAPSULE | Freq: Two times a day (BID) | ORAL | 0 refills | Status: AC

## 2022-04-01 MED ORDER — POTASSIUM CHLORIDE CRYS ER 20 MEQ PO TBCR
30.0000 meq | EXTENDED_RELEASE_TABLET | Freq: Once | ORAL | Status: AC
  Administered 2022-04-01: 30 meq via ORAL
  Filled 2022-04-01: qty 1

## 2022-04-01 MED ORDER — METRONIDAZOLE 500 MG PO TABS: 500.0000 mg | ORAL_TABLET | Freq: Two times a day (BID) | ORAL | 0 refills | Status: AC

## 2022-04-01 MED ORDER — METHADONE HCL 5 MG PO TABS
5.0000 mg | ORAL_TABLET | Freq: Two times a day (BID) | ORAL | Status: DC
  Administered 2022-04-01: 5 mg via ORAL
  Filled 2022-04-01: qty 1

## 2022-04-01 MED ORDER — HYDROMORPHONE HCL 2 MG PO TABS: 2.0000 mg | ORAL_TABLET | Freq: Four times a day (QID) | ORAL | 0 refills | Status: DC | PRN

## 2022-04-01 MED ORDER — METHADONE HCL 5 MG PO TABS: 5.0000 mg | ORAL_TABLET | Freq: Two times a day (BID) | ORAL | 0 refills | Status: AC

## 2022-04-01 MED ORDER — POLYETHYLENE GLYCOL 3350 17 G PO PACK: 17.0000 g | PACK | Freq: Every day | ORAL | 0 refills | Status: AC | PRN

## 2022-04-01 MED ORDER — SODIUM CHLORIDE 0.9% FLUSH: 10.0000 mL | INTRAVENOUS | 3 refills | Status: AC | PRN
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
## 2022-04-01 NOTE — TOC Transition Note (Signed)
Transition of Care [facility]) - CM/SW Discharge Note   Patient Details  Name: Jonathan Ray MRN: [id number] Date of Birth: Dec 11, 1955  Transition of Care [facility]) CM/SW Contact:  Vassie Moselle, [place] Phone Number: 04/01/2022, 1:00 PM   Clinical Narrative:    Pt is to return home at discharge and is to follow up with Palliative care in one week. Pt has no identified DME needs. No further TOC needs identified.    Final next level of care: Home/Self Care Barriers to Discharge: Barriers Resolved   Patient Goals and CMS Choice Patient states their goals for this hospitalization and ongoing recovery are:: To return home   Choice offered to / list presented to : Patient  Discharge Placement                       Discharge Plan and Services In-house Referral: Hospice / Palliative Care              DME Arranged: N/A DME Agency: NA                  Social Determinants of Health (SDOH) Interventions     Readmission Risk Interventions     No data to display

## 2022-04-01 NOTE — Progress Notes (Signed)
Patient educated on foley care including flushing foley catheter as needed at home. He verbalized understanding.

## 2022-04-01 NOTE — Progress Notes (Signed)
Chaplain engaged in an initial visit with [place].  Jonathan Ray was using the bathroom and in need of assistance upon Chaplain's arrival.  Jonathan Ray was able to get his nurse.  When Chaplain came back, Jonathan Ray was still getting himself cleaned up and situated before his wife's arrival.  Chaplain wanted to honor [employer] and time with his wife.  Chaplain will follow-up later today.  Chaplain offered support, community, and listening.     04/01/22 1100  Clinical Encounter Type  Visited With Patient  Visit Type Initial

## 2022-04-01 NOTE — Discharge Summary (Signed)
Discharge Summary  Jonathan Ray BHA:[id number] DOB: 1956-08-28  PCP: [name], DO  Admit date: 03/29/2022 Discharge date: 04/01/2022  Time spent: 62mns, more than 50% time spent on coordination of care.   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   Complicated UTI (urinary tract infection) 03/30/2022    Priority: 2.   Acute diarrhea 03/30/2022    Priority: 3.   COPD (chronic obstructive pulmonary disease) ([facility] 03/30/2022    Priority: 4.   GERD without esophagitis 03/30/2022    Priority: 5.   Hyponatremia 03/30/2022    Priority: 6.   Malignant tumor of urinary bladder ([facility] 01/01/2022    Priority: 7.    Resolved Hospital Problems  No resolved problems to display.    Discharge Condition: stable  Diet recommendation: regular diet  Filed Weights   03/29/22 2123 03/31/22 0616 04/01/22 0459  Weight: 86.2 kg 86.3 kg 80.6 kg    History of present illness: ( per admitting MD Dr SCyd Silence 66year old male with past medical history of COPD, gastroesophageal reflux disease, testicular cancer (2001, S/P radical left orchiectomy and chemo), as well as stage IV bladder cancer (Dx 2021, metastatic to omentum with lymphadenopathy, follows with Dr. SAlen Blew having recently been placed on pembrolizumab on 6/16.  Patient is now presenting with diarrhea as well as a drastic change in the color and quality of his urine.  Hospital Course:  Principal Problem:   Vesicocolonic fistula Active Problems:   Complicated UTI (urinary tract infection)   Acute diarrhea   COPD (chronic obstructive pulmonary disease) (HCC)   GERD without  esophagitis   Hyponatremia   Malignant tumor of urinary bladder (HCC)   Assessment and Plan:   * Vesicocolonic fistula/complicated uti/advanced bladder cancer -Seen by general surgery not felt to be a candidate for surgery, urology Dr WJeffie Pollockrecommended large bore Foley catheter, he is tolerating this, per urology Dr WRoni Breadif patient cannot tolerate Foley catheter will need to consider bilateral nephrostomy tube, he is to follow up with urology -Large bore Foley catheter placed on 6/21, drainage consistent with stool consistency initially, on 6/21 drainage is more consistent with urine -Empiric intravenous antibiotics with ceftriaxone and Flagyl started on admission  -Blood culture no growth,  urine cultures + pan sensitive ecoli -seen by oncology Dr SAlen Blewwho recommend palliative care consulted for goals of care discussion -seen by palliative care, patient desires to go home continue outpatient follow up with oncology/urology and palliative care, case discussed with palliative care Dr GHilma Favorswho agree patient would benefit from discharge on chronic suppressive abx with keflex and flagyl, patient is to close follow up after discharge     Acute diarrhea Acute onset of diarrhea since first dose of Keytruda was administered last Friday This is likely a medication related side effect C. difficile testing  negative, gi prc panel negative , stool culture in process Diarrhea slowed down     Hyponatremia Likely from GI loss due to diarrhea Sodium 125 on presentation Sodium improving /normalized after receiving hydration, encourage oral intake    Normocytic anemia, likely anemia of chronic disease Currently no overt sign of external bleeding  hemoglobin above 9  COPD (chronic obstructive pulmonary disease) (HCC) No evidence of COPD exacerbation this time As needed bronchodilator therapy for episodic shortness of breath and wheezing.       Discharge Exam: BP (!) 145/71 (BP Location:  Right Arm)   Pulse 76   Temp 98.4 F (36.9 C)   Resp 19   Ht '6\' 1"'$  (1.854 m)   Wt 80.6 kg   SpO2 100%   BMI 23.44 kg/m   General: NAD, aaox3 Cardiovascular: RRR Respiratory: normal respiratory effort     Discharge Instructions     Diet general   Complete by: As directed    Increase activity slowly   Complete by: As directed       Allergies as of 04/01/2022       Reactions   Penicillins Other (See Comments)   Does not remember what happens-childhood allergy. Has tolerated amoxicillin in the past         Medication List     STOP taking these medications    oxyCODONE-acetaminophen 5-325 MG tablet Commonly known as: PERCOCET/ROXICET       TAKE these medications    cephALEXin 500 MG capsule Commonly known as: KEFLEX Take 1 capsule (500 mg total) by mouth 2 (two) times daily.   diazepam 10 MG tablet Commonly known as: VALIUM Take 1 tablet (10 mg total) by mouth every 12 (twelve) hours as needed for anxiety. What changed:  when to take this reasons to take this   HYDROmorphone 2 MG tablet Commonly known as: Dilaudid Take 1 tablet (2 mg total) by mouth every 6 (six) hours as needed for severe pain.   lidocaine-prilocaine cream Commonly known as: EMLA Apply 1 application. topically as needed.   methadone 5 MG tablet Commonly known as: DOLOPHINE Take 1 tablet (5 mg total) by mouth every 12 (twelve) hours.   metroNIDAZOLE 500 MG tablet Commonly known as: Flagyl Take 1 tablet (500 mg total) by mouth 2 (two) times daily.   polyethylene glycol 17 g packet Commonly known as: MIRALAX / GLYCOLAX Take 17 g by mouth daily as needed for mild constipation.   sodium chloride flush 0.9 % Soln Commonly known as: NS 10-40 mLs by Intracatheter route as needed (flush).   tamsulosin 0.4 MG Caps capsule Commonly known as: FLOMAX Take 0.4 mg by mouth daily.   Uribel 118 MG Caps Take 1 capsule by mouth every 6 (six) hours as needed.       Allergies   Allergen Reactions   Penicillins Other (See Comments)    Does not remember what happens-childhood allergy. Has tolerated amoxicillin in the past     Follow-up Information     [facility] Follow up.   Why: Office will contact you to arrange catheter change in about 2-3 weeks. Contact information: [address] [place] [phone]                 The results of significant diagnostics from this hospitalization (including imaging, microbiology, ancillary and laboratory) are listed below for reference.    Significant Diagnostic Studies: CT ABDOMEN PELVIS W CONTRAST  Result Date: 03/30/2022 CLINICAL DATA:  Left lower quadrant abdominal pain and diarrhea. History of bladder cancer. EXAM: CT ABDOMEN AND PELVIS WITH CONTRAST TECHNIQUE: Multidetector CT imaging of the abdomen and pelvis was performed using the standard protocol following bolus administration of intravenous contrast. RADIATION DOSE REDUCTION: This exam was performed according to the departmental dose-optimization program which includes automated exposure control,  adjustment of the mA and/or kV according to patient size and/or use of iterative reconstruction technique. CONTRAST:  139m OMNIPAQUE IOHEXOL 300 MG/ML  SOLN COMPARISON:  02/24/2022 FINDINGS: Lower chest: Lung bases are clear. Hepatobiliary: Multiple subcentimeter low-attenuation lesions demonstrated throughout the liver, too small to characterize. No change since prior study. Gallbladder is normal. Mild intrahepatic bile duct dilatation. Pancreas: Subcentimeter cystic lesion in the head of the pancreas without change since prior study. No pancreatic ductal dilatation or inflammatory change. Spleen: Normal in size without focal abnormality. Adrenals/Urinary Tract: No adrenal gland nodules. Kidneys are symmetrical. No hydronephrosis or hydroureter. Asymmetrical thickening of the anterior bladder wall with extension into the  surrounding tissues corresponding to known bladder tumor. There appears to be a fistula with adjacent bowel. Tiny amount of air in the bladder. Bladder diverticula posteriorly. Stomach/Bowel: Mildly distended fluid-filled small bowel with transition zone in the right lower quadrant. Colon is decompressed with scattered stool. Rectosigmoid wall is thickened, possibly due to under distention or colitis. Appendix is normal. Vascular/Lymphatic: Calcification of the aorta. No aneurysm. No significant retroperitoneal lymphadenopathy. Moderately prominent lymph nodes in the pelvic iliac chains bilaterally are likely metastatic. No change. Reproductive: Prostate gland is enlarged, measuring 5.6 cm in diameter. Other: Numerous peritoneal and omental low-attenuation nodules demonstrated throughout the abdomen. Largest lesions measure up to about 2.9 cm diameter. No change since prior study. Musculoskeletal: No acute or significant osseous findings. IMPRESSION: 1. Anterior bladder mass consistent with known cancer. The mass extends into the surrounding soft tissues with an apparent fistula to adjacent bowel. 2. Fluid-filled mildly dilated small bowel possibly representing obstruction or ileus. The colon is decompressed. Wall thickening of the rectosigmoid colon could be due to under distention or colitis. 3. Multiple omental nodules and pelvic lymphadenopathy consistent with metastatic disease. 4. Mild intrahepatic bile duct dilatation, new since prior study. Cause is indeterminate. 5. Multiple low-attenuation lesions throughout the liver, measuring less than 1 cm. Lesions are too small to characterize but likely represent cysts. No change. 6. Aortic atherosclerosis. 7. Prostate gland is enlarged. 8. Unchanged appearance of 1 cm cystic lesion in the head of the pancreas. Electronically Signed   By: WLucienne CapersM.D.   On: 03/30/2022 00:26    Microbiology: Recent Results (from the past 240 hour(s))  Urine Culture      Status: Abnormal   Collection Time: 03/29/22  9:58 PM   Specimen: Urine, Clean Catch  Result Value Ref Range Status   Specimen Description   Final    URINE, CLEAN CATCH Performed at [facility] [address] [address] [place] [address]   Special Requests   Final    NONE [facility]formed at [facility] [address] [place] [address]   Culture >=100,000 COLONIES/mL ESCHERICHIA COLI (A)  Final   Report Status 03/31/2022 FINAL  Final   Organism ID, Bacteria ESCHERICHIA COLI (A)  Final      Susceptibility   Escherichia coli - MIC*    AMPICILLIN <=2 SENSITIVE Sensitive     CEFAZOLIN <=4 SENSITIVE Sensitive     CEFEPIME <=0.12 SENSITIVE Sensitive     CEFTRIAXONE <=0.25 SENSITIVE Sensitive     CIPROFLOXACIN <=0.25 SENSITIVE Sensitive     GENTAMICIN <=1 SENSITIVE Sensitive     IMIPENEM <=0.25 SENSITIVE Sensitive     NITROFURANTOIN <=16 SENSITIVE Sensitive     TRIMETH/SULFA <=20 SENSITIVE Sensitive     AMPICILLIN/SULBACTAM <=2 SENSITIVE Sensitive     PIP/TAZO <=4 SENSITIVE Sensitive     * >=  100,000 COLONIES/mL ESCHERICHIA COLI  C Difficile Quick Screen w PCR reflex     Status: None   Collection Time: 03/29/22 10:14 PM   Specimen: STOOL  Result Value Ref Range Status   C Diff antigen NEGATIVE NEGATIVE Final   C Diff toxin NEGATIVE NEGATIVE Final   C Diff interpretation No C. difficile detected.  Final    Comment: Performed at [facility], [place] [address], [place], [place] [address]  Stool culture     Status: None (Preliminary result)   Collection Time: 03/29/22 10:14 PM   Specimen: STOOL  Result Value Ref Range Status   Salmonella/Shigella Screen Final report  Final   Campylobacter Culture PENDING  Incomplete   E coli, Shiga toxin Assay Negative Negative Final    Comment: (NOTE) Performed At: [facility] [place], [place] [id number] Rush Farmer MD WI:[id number]   STOOL CULTURE REFLEX -  RSASHR     Status: None   Collection Time: 03/29/22 10:14 PM  Result Value Ref Range Status   Stool Culture result 1 (RSASHR) Comment  Final    Comment: (NOTE) No Salmonella or Shigella recovered. Performed At: [facility] [place], [place] [id number] Rush Farmer MD EH:[id number]   Blood culture (routine x 2)     Status: None (Preliminary result)   Collection Time: 03/29/22 10:23 PM   Specimen: BLOOD RIGHT FOREARM  Result Value Ref Range Status   Specimen Description   Final    BLOOD RIGHT FOREARM Performed at [facility] [address] [place], [place] [address]    Special Requests   Final    BOTTLES DRAWN AEROBIC AND ANAEROBIC Blood Culture adequate volume Performed at [facility] [address], [place], [place] [address]    Culture   Final    NO GROWTH 3 DAYS Performed at [facility], [place] [address] [place], [place] [address]    Report Status PENDING  Incomplete  Blood culture (routine x 2)     Status: None (Preliminary result)   Collection Time: 03/29/22 10:23 PM   Specimen: BLOOD  Result Value Ref Range Status   Specimen Description   Final    BLOOD LEFT ANTECUBITAL Performed at [facility] [address] [place], [place] [address]    Special Requests   Final    BOTTLES DRAWN AEROBIC AND ANAEROBIC Blood Culture adequate volume Performed at [facility] [address] [place], [place] [address]    Culture   Final    NO GROWTH 3 DAYS Performed at [facility], [place] [address], [place],  [address]    Report Status PENDING  Incomplete  Stool culture     Status: None (Preliminary result)   Collection Time: 03/30/22  2:45 AM  Result Value Ref Range Status   Salmonella/Shigella Screen Final report  Final   Campylobacter Culture PENDING  Incomplete   E coli, Shiga toxin Assay Negative Negative Final    Comment: (NOTE) Performed At: [facility] [place], [place] [id number] Rush Farmer MD XK:[id number]   Gastrointestinal Panel by PCR , Stool     Status: None   Collection Time: 03/30/22  2:45 AM   Specimen: Stool  Result Value Ref Range Status   Campylobacter species NOT DETECTED NOT DETECTED Final   Plesimonas shigelloides NOT DETECTED NOT DETECTED Final   Salmonella species NOT DETECTED NOT DETECTED Final   Yersinia  enterocolitica NOT DETECTED NOT DETECTED Final   Vibrio species NOT DETECTED NOT DETECTED Final   Vibrio cholerae NOT DETECTED NOT DETECTED Final   Enteroaggregative E coli (EAEC) NOT DETECTED NOT DETECTED Final   Enteropathogenic E coli (EPEC) NOT DETECTED NOT DETECTED Final   Enterotoxigenic E coli (ETEC) NOT DETECTED NOT DETECTED Final   Shiga like toxin producing E coli (STEC) NOT DETECTED NOT DETECTED Final   Shigella/Enteroinvasive E coli (EIEC) NOT DETECTED NOT DETECTED Final   Cryptosporidium NOT DETECTED NOT DETECTED Final   Cyclospora cayetanensis NOT DETECTED NOT DETECTED Final   Entamoeba histolytica NOT DETECTED NOT DETECTED Final   Giardia lamblia NOT DETECTED NOT DETECTED Final   Adenovirus F40/41 NOT DETECTED NOT DETECTED Final   Astrovirus NOT DETECTED NOT DETECTED Final   Norovirus GI/GII NOT DETECTED NOT DETECTED Final   Rotavirus A NOT DETECTED NOT DETECTED Final   Sapovirus (I, II, IV, and V) NOT DETECTED NOT DETECTED Final    Comment: Performed at [facility], [address] [place], [place] [address]  STOOL CULTURE REFLEX - RSASHR     Status: None   Collection Time: 03/30/22  2:45 AM  Result Value Ref Range Status   Stool Culture result 1 (RSASHR) Comment  Final    Comment: (NOTE) No Salmonella or Shigella recovered. Performed At: [facility] [place], [place] [id number] Rush Farmer MD JM:[id number]      Labs: Basic Metabolic Panel: Recent Labs  Lab 03/30/22 0557 03/30/22 1647 03/30/22 1853  03/31/22 0629 04/01/22 0424  NA 129* 132* 133* 134* 138  K 4.3 4.1 3.9 3.8 3.3*  CL 97* 100 100 104 104  CO2 '23 22 24 '$ 21* 26  GLUCOSE 94 101* 112* 83 108*  BUN 31* '20 20 15 10  '$ CREATININE 0.79 0.84 0.72 0.66 0.70  CALCIUM 8.9 9.1 9.0 8.7* 9.1  MG 1.7  --   --  1.8 1.7   Liver Function Tests: Recent Labs  Lab 03/26/22 1206 03/29/22 2223 03/30/22 0557 03/31/22 0629  AST 10* 17 16 13*  ALT '5 11 11 10  '$ ALKPHOS 61 54 50 48  BILITOT 0.4 0.6 0.5 0.6  PROT 6.4* 6.3* 6.2* 6.0*  ALBUMIN 3.9 3.5 3.4* 3.2*   Recent Labs  Lab 03/29/22 2223  LIPASE 22   No results for input(s): "AMMONIA" in the last 168 hours. CBC: Recent Labs  Lab 03/26/22 1206 03/29/22 2223 03/30/22 0557 03/31/22 0629 04/01/22 0424  WBC 6.1 7.3 6.7 4.8 5.5  NEUTROABS 4.3 5.6 5.2 2.9  --   HGB 10.0* 9.8* 9.9* 9.4* 9.6*  HCT 29.7* 28.8* 28.3* 27.9* 29.1*  MCV 89.5 88.9 87.3 89.1 90.1  PLT 312 244 215 227 225   Cardiac Enzymes: No results for input(s): "CKTOTAL", "CKMB", "CKMBINDEX", "TROPONINI" in the last 168 hours. BNP: BNP (last 3 results) No results for input(s): "BNP" in the last 8760 hours.  ProBNP (last 3 results) No results for input(s): "PROBNP" in the last 8760 hours.  CBG: No results for input(s): "GLUCAP" in the last 168 hours.  FURTHER DISCHARGE INSTRUCTIONS:   Get Medicines reviewed and adjusted: Please take all your medications with you for your next visit with your Primary MD   Laboratory/radiological data: Please request your Primary MD to go over all hospital tests and procedure/radiological results at the follow up, please ask your Primary MD to get all Hospital records sent to his/her office.   In some cases, they will be blood work, cultures  and biopsy results pending at the time of your discharge. Please request that your primary care M.D. goes through all the records of your hospital data and follows up on these results.   Also Note the following: If you experience  worsening of your admission symptoms, develop shortness of breath, life threatening emergency, suicidal or homicidal thoughts you must seek medical attention immediately by calling 911 or calling your MD immediately  if symptoms less severe.   You must read complete instructions/literature along with all the possible adverse reactions/side effects for all the Medicines you take and that have been prescribed to you. Take any new Medicines after you have completely understood and accpet all the possible adverse reactions/side effects.    Do not drive when taking Pain medications or sleeping medications (Benzodaizepines)   Do not take more than prescribed Pain, Sleep and Anxiety Medications. It is not advisable to combine anxiety,sleep and pain medications without talking with your primary care practitioner   Special Instructions: If you have smoked or chewed Tobacco  in the last 2 yrs please stop smoking, stop any regular Alcohol  and or any Recreational drug use.   Wear Seat belts while driving.   Please note: You were cared for by a hospitalist during your hospital stay. Once you are discharged, your primary care physician will handle any further medical issues. Please note that NO REFILLS for any discharge medications will be authorized once you are discharged, as it is imperative that you return to your primary care physician (or establish a relationship with a primary care physician if you do not have one) for your post hospital discharge needs so that they can reassess your need for medications and monitor your lab values.     Signed:  Florencia Reasons MD, PhD, FACP  [facility] 04/01/2022, 3:37 PM

## 2022-04-01 NOTE — Progress Notes (Signed)
Chaplain followed up with Jonathan Ray and his wife and provided spiritual support as they prepare to discharge.  He is new to his faith, and he wanted to speak with a chaplain.  Chaplain provided prayer and listening as he and his wife shared about both his spiritual journey and his health journey.  [address], [place] Pager, [phone]

## 2022-04-02 ENCOUNTER — Telehealth: Payer: Self-pay

## 2022-04-02 ENCOUNTER — Other Ambulatory Visit (HOSPITAL_COMMUNITY): Payer: Self-pay

## 2022-04-02 ENCOUNTER — Telehealth: Payer: Self-pay | Admitting: *Deleted

## 2022-04-02 ENCOUNTER — Other Ambulatory Visit: Payer: Self-pay | Admitting: Internal Medicine

## 2022-04-02 ENCOUNTER — Encounter: Payer: Self-pay | Admitting: Oncology

## 2022-04-02 DIAGNOSIS — Z515 Encounter for palliative care: Secondary | ICD-10-CM | POA: Insufficient documentation

## 2022-04-02 MED ORDER — HYDROMORPHONE HCL 2 MG PO TABS: 2.0000 mg | ORAL_TABLET | Freq: Four times a day (QID) | ORAL | 0 refills | Status: DC | PRN

## 2022-04-02 NOTE — Telephone Encounter (Signed)
PC to [facility], spoke with triage nurse regarding this mutual patient.  Patient's wife called this morning, he was DC'd home from hospital yesterday, dx'd with vesicocolonic fistula.  He has catheter in place & they feel they need assistance with catheter care, flushing, ensuring proper urinary output, etc.  Triage nurse states she will contact Dr. Berneice Heinrich about possible home health catheter teaching, she will also contact patient's wife Joyce Gross.

## 2022-04-03 ENCOUNTER — Other Ambulatory Visit (HOSPITAL_COMMUNITY): Payer: Self-pay

## 2022-04-03 ENCOUNTER — Encounter: Payer: Self-pay | Admitting: Oncology

## 2022-04-03 LAB — CULTURE, BLOOD (ROUTINE X 2)
Culture: NO GROWTH
Culture: NO GROWTH
Special Requests: ADEQUATE
Special Requests: ADEQUATE

## 2022-04-03 LAB — STOOL CULTURE REFLEX - RSASHR

## 2022-04-03 LAB — STOOL CULTURE
E coli, Shiga toxin Assay: NEGATIVE
E coli, Shiga toxin Assay: NEGATIVE

## 2022-04-03 LAB — STOOL CULTURE REFLEX - CMPCXR

## 2022-04-03 MED ORDER — NORMAL SALINE FLUSH 0.9 % IV SOLN
INTRAVENOUS | 3 refills | Status: AC
  Filled 2022-04-03: qty 500, 12d supply, fill #0

## 2022-04-04 ENCOUNTER — Other Ambulatory Visit: Payer: Self-pay | Admitting: Urology

## 2022-04-07 ENCOUNTER — Inpatient Hospital Stay (HOSPITAL_COMMUNITY)
Admission: EM | Admit: 2022-04-07 | Discharge: 2022-04-09 | DRG: 699 | Disposition: A | Discharge status: Home Health | Payer: 59 | Attending: Family Medicine | Admitting: Family Medicine

## 2022-04-07 ENCOUNTER — Telehealth: Payer: Self-pay | Admitting: *Deleted

## 2022-04-07 ENCOUNTER — Emergency Department (HOSPITAL_COMMUNITY): Payer: 59

## 2022-04-07 ENCOUNTER — Encounter (HOSPITAL_COMMUNITY): Payer: Self-pay

## 2022-04-07 ENCOUNTER — Inpatient Hospital Stay: Payer: 59 | Admitting: Nurse Practitioner

## 2022-04-07 DIAGNOSIS — G8929 Other chronic pain: Secondary | ICD-10-CM | POA: Diagnosis present

## 2022-04-07 DIAGNOSIS — F1021 Alcohol dependence, in remission: Secondary | ICD-10-CM | POA: Diagnosis present

## 2022-04-07 DIAGNOSIS — R7989 Other specified abnormal findings of blood chemistry: Secondary | ICD-10-CM | POA: Diagnosis not present

## 2022-04-07 DIAGNOSIS — C679 Malignant neoplasm of bladder, unspecified: Secondary | ICD-10-CM | POA: Diagnosis not present

## 2022-04-07 DIAGNOSIS — Z79899 Other long term (current) drug therapy: Secondary | ICD-10-CM

## 2022-04-07 DIAGNOSIS — Z87820 Personal history of traumatic brain injury: Secondary | ICD-10-CM | POA: Diagnosis not present

## 2022-04-07 DIAGNOSIS — J9811 Atelectasis: Secondary | ICD-10-CM | POA: Diagnosis not present

## 2022-04-07 DIAGNOSIS — Z515 Encounter for palliative care: Secondary | ICD-10-CM

## 2022-04-07 DIAGNOSIS — J449 Chronic obstructive pulmonary disease, unspecified: Secondary | ICD-10-CM | POA: Diagnosis present

## 2022-04-07 DIAGNOSIS — F17210 Nicotine dependence, cigarettes, uncomplicated: Secondary | ICD-10-CM | POA: Diagnosis present

## 2022-04-07 DIAGNOSIS — Y846 Urinary catheterization as the cause of abnormal reaction of the patient, or of later complication, without mention of misadventure at the time of the procedure: Secondary | ICD-10-CM | POA: Diagnosis not present

## 2022-04-07 DIAGNOSIS — R197 Diarrhea, unspecified: Secondary | ICD-10-CM | POA: Diagnosis not present

## 2022-04-07 DIAGNOSIS — Z66 Do not resuscitate: Secondary | ICD-10-CM | POA: Diagnosis not present

## 2022-04-07 DIAGNOSIS — Z792 Long term (current) use of antibiotics: Secondary | ICD-10-CM

## 2022-04-07 DIAGNOSIS — R600 Localized edema: Secondary | ICD-10-CM | POA: Diagnosis not present

## 2022-04-07 DIAGNOSIS — Z8547 Personal history of malignant neoplasm of testis: Secondary | ICD-10-CM

## 2022-04-07 DIAGNOSIS — K219 Gastro-esophageal reflux disease without esophagitis: Secondary | ICD-10-CM | POA: Diagnosis present

## 2022-04-07 DIAGNOSIS — C786 Secondary malignant neoplasm of retroperitoneum and peritoneum: Secondary | ICD-10-CM | POA: Diagnosis present

## 2022-04-07 DIAGNOSIS — Z8551 Personal history of malignant neoplasm of bladder: Secondary | ICD-10-CM

## 2022-04-07 DIAGNOSIS — Z880 Allergy status to penicillin: Secondary | ICD-10-CM | POA: Diagnosis not present

## 2022-04-07 DIAGNOSIS — E871 Hypo-osmolality and hyponatremia: Secondary | ICD-10-CM | POA: Diagnosis not present

## 2022-04-07 DIAGNOSIS — E876 Hypokalemia: Secondary | ICD-10-CM | POA: Diagnosis not present

## 2022-04-07 DIAGNOSIS — Z79891 Long term (current) use of opiate analgesic: Secondary | ICD-10-CM

## 2022-04-07 DIAGNOSIS — D630 Anemia in neoplastic disease: Secondary | ICD-10-CM | POA: Diagnosis present

## 2022-04-07 DIAGNOSIS — N321 Vesicointestinal fistula: Secondary | ICD-10-CM | POA: Diagnosis present

## 2022-04-07 DIAGNOSIS — R338 Other retention of urine: Secondary | ICD-10-CM | POA: Diagnosis present

## 2022-04-07 DIAGNOSIS — T83091A Other mechanical complication of indwelling urethral catheter, initial encounter: Secondary | ICD-10-CM | POA: Diagnosis not present

## 2022-04-07 DIAGNOSIS — Z09 Encounter for follow-up examination after completed treatment for conditions other than malignant neoplasm: Secondary | ICD-10-CM | POA: Diagnosis not present

## 2022-04-07 DIAGNOSIS — F419 Anxiety disorder, unspecified: Secondary | ICD-10-CM | POA: Diagnosis not present

## 2022-04-07 DIAGNOSIS — Z9221 Personal history of antineoplastic chemotherapy: Secondary | ICD-10-CM | POA: Diagnosis not present

## 2022-04-07 DIAGNOSIS — R69 Illness, unspecified: Secondary | ICD-10-CM | POA: Diagnosis not present

## 2022-04-07 DIAGNOSIS — N3289 Other specified disorders of bladder: Secondary | ICD-10-CM | POA: Diagnosis not present

## 2022-04-07 DIAGNOSIS — N401 Enlarged prostate with lower urinary tract symptoms: Secondary | ICD-10-CM | POA: Diagnosis present

## 2022-04-07 DIAGNOSIS — Z978 Presence of other specified devices: Secondary | ICD-10-CM | POA: Diagnosis not present

## 2022-04-07 LAB — MRSA NEXT GEN BY PCR, NASAL: MRSA by PCR Next Gen: NOT DETECTED

## 2022-04-07 LAB — LIPASE, BLOOD: Lipase: 25 U/L (ref 11–51)

## 2022-04-07 LAB — SODIUM
Sodium: 120 mmol/L — ABNORMAL LOW (ref 135–145)
Sodium: 123 mmol/L — ABNORMAL LOW (ref 135–145)

## 2022-04-07 LAB — SODIUM, URINE, RANDOM: Sodium, Ur: 31 mmol/L

## 2022-04-07 LAB — URINALYSIS, ROUTINE W REFLEX MICROSCOPIC
Bilirubin Urine: NEGATIVE
Glucose, UA: NEGATIVE mg/dL
Ketones, ur: NEGATIVE mg/dL
Nitrite: NEGATIVE
Protein, ur: NEGATIVE mg/dL
Specific Gravity, Urine: 1.003 — ABNORMAL LOW (ref 1.005–1.030)
pH: 5 (ref 5.0–8.0)

## 2022-04-07 LAB — COMPREHENSIVE METABOLIC PANEL
ALT: 18 U/L (ref 0–44)
AST: 25 U/L (ref 15–41)
Albumin: 3.7 g/dL (ref 3.5–5.0)
Alkaline Phosphatase: 49 U/L (ref 38–126)
Anion gap: 13 (ref 5–15)
BUN: 19 mg/dL (ref 8–23)
CO2: 24 mmol/L (ref 22–32)
Calcium: 8 mg/dL — ABNORMAL LOW (ref 8.9–10.3)
Chloride: 80 mmol/L — ABNORMAL LOW (ref 98–111)
Creatinine, Ser: 0.91 mg/dL (ref 0.61–1.24)
GFR, Estimated: >60 mL/min (ref >60)
Glucose, Bld: 95 mg/dL (ref 70–99)
Potassium: 3.3 mmol/L — ABNORMAL LOW (ref 3.5–5.1)
Sodium: 117 mmol/L — CL (ref 135–145)
Total Bilirubin: 0.9 mg/dL (ref 0.3–1.2)
Total Protein: 6.3 g/dL — ABNORMAL LOW (ref 6.5–8.1)

## 2022-04-07 LAB — CBC WITH DIFFERENTIAL/PLATELET
Abs Immature Granulocytes: 0.05 10*3/uL (ref 0.00–0.07)
Basophils Absolute: 0 10*3/uL (ref 0.0–0.1)
Basophils Relative: 1 %
Eosinophils Absolute: 0 10*3/uL (ref 0.0–0.5)
Eosinophils Relative: 1 %
HCT: 29 % — ABNORMAL LOW (ref 39.0–52.0)
Hemoglobin: 10.2 g/dL — ABNORMAL LOW (ref 13.0–17.0)
Immature Granulocytes: 1 %
Lymphocytes Relative: 14 %
Lymphs Abs: 0.8 10*3/uL (ref 0.7–4.0)
MCH: 29.8 pg (ref 26.0–34.0)
MCHC: 35.2 g/dL (ref 30.0–36.0)
MCV: 84.8 fL (ref 80.0–100.0)
Monocytes Absolute: 0.5 10*3/uL (ref 0.1–1.0)
Monocytes Relative: 9 %
Neutro Abs: 4.4 10*3/uL (ref 1.7–7.7)
Neutrophils Relative %: 74 %
Platelets: 238 10*3/uL (ref 150–400)
RBC: 3.42 MIL/uL — ABNORMAL LOW (ref 4.22–5.81)
RDW: 14.9 % (ref 11.5–15.5)
WBC: 5.9 10*3/uL (ref 4.0–10.5)
nRBC: 0 % (ref 0.0–0.2)

## 2022-04-07 LAB — TSH: TSH: 0.214 u[IU]/mL — ABNORMAL LOW (ref 0.350–4.500)

## 2022-04-07 LAB — CORTISOL-AM, BLOOD: Cortisol - AM: 11 ug/dL (ref 6.7–22.6)

## 2022-04-07 LAB — OSMOLALITY, URINE: Osmolality, Ur: 127 mOsm/kg — ABNORMAL LOW (ref 300–900)

## 2022-04-07 LAB — BRAIN NATRIURETIC PEPTIDE: B Natriuretic Peptide: 12.1 pg/mL (ref 0.0–100.0)

## 2022-04-07 LAB — GLUCOSE, CAPILLARY: Glucose-Capillary: 88 mg/dL (ref 70–99)

## 2022-04-07 MED ORDER — CEPHALEXIN 500 MG PO CAPS
500.0000 mg | ORAL_CAPSULE | Freq: Two times a day (BID) | ORAL | Status: DC
  Administered 2022-04-07 – 2022-04-09 (×4): 500 mg via ORAL
  Filled 2022-04-07 (×4): qty 1

## 2022-04-07 MED ORDER — TAMSULOSIN HCL 0.4 MG PO CAPS
0.4000 mg | ORAL_CAPSULE | Freq: Every day | ORAL | Status: DC
  Administered 2022-04-08 – 2022-04-09 (×2): 0.4 mg via ORAL
  Filled 2022-04-07 (×2): qty 1

## 2022-04-07 MED ORDER — ENOXAPARIN SODIUM 40 MG/0.4ML IJ SOSY
40.0000 mg | PREFILLED_SYRINGE | Freq: Every day | INTRAMUSCULAR | Status: DC
Start: 2022-04-07 — End: 2022-04-09
  Administered 2022-04-07 – 2022-04-08 (×2): 40 mg via SUBCUTANEOUS
  Filled 2022-04-07 (×2): qty 0.4

## 2022-04-07 MED ORDER — SODIUM CHLORIDE 3 % IV SOLN
INTRAVENOUS | Status: DC
Start: 2022-04-07 — End: 2022-04-07
  Filled 2022-04-07 (×2): qty 500

## 2022-04-07 MED ORDER — SODIUM CHLORIDE 3 % IV SOLN: INTRAVENOUS | Status: DC

## 2022-04-07 MED ORDER — SODIUM CHLORIDE 0.9 % IV BOLUS
500.0000 mL | Freq: Once | INTRAVENOUS | Status: AC
  Administered 2022-04-07: 500 mL via INTRAVENOUS

## 2022-04-07 MED ORDER — METHADONE HCL 10 MG PO TABS
5.0000 mg | ORAL_TABLET | Freq: Two times a day (BID) | ORAL | Status: DC
  Administered 2022-04-07 – 2022-04-09 (×4): 5 mg via ORAL
  Filled 2022-04-07 (×4): qty 1

## 2022-04-07 MED ORDER — METRONIDAZOLE 500 MG PO TABS
500.0000 mg | ORAL_TABLET | Freq: Two times a day (BID) | ORAL | Status: DC
  Administered 2022-04-07 – 2022-04-09 (×4): 500 mg via ORAL
  Filled 2022-04-07 (×4): qty 1

## 2022-04-07 MED ORDER — OXYBUTYNIN CHLORIDE 5 MG PO TABS: 5.0000 mg | ORAL_TABLET | Freq: Three times a day (TID) | ORAL | Status: DC | PRN

## 2022-04-07 MED ORDER — DIAZEPAM 5 MG PO TABS: 10.0000 mg | ORAL_TABLET | Freq: Two times a day (BID) | ORAL | Status: DC | PRN

## 2022-04-07 MED ORDER — CHLORHEXIDINE GLUCONATE CLOTH 2 % EX PADS
6.0000 | MEDICATED_PAD | Freq: Every day | CUTANEOUS | Status: DC
  Administered 2022-04-07 – 2022-04-08 (×2): 6 via TOPICAL

## 2022-04-07 NOTE — ED Triage Notes (Signed)
Pt states he was seen at PCP earlier today and was noted to be hyponatremic. Pt also reports little output from his foley catheter in the last 12 hours. A small amount of green urine noted in the bag during triage. Pt reports that he was due to meet with palliative care team to establish care today r/t his cancer diagnosis.
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
## 2022-04-07 NOTE — Telephone Encounter (Signed)
Patient's wife called. She states husband has appt with Palliative Care at 1330 and that he is in ED right now. PCP sent him to ED after labs this AM showed he had very low sodium levels. She asked if some one could let Palliative Care know he couldn't come to appt. She said he wants to come, but not today.   Informed Ms. Debera Lat, NP, Palliative Care provider of this information. She states she will follow patient's treatment and contact them tomorrow to reschedule his appt or see him in hospital if he's admitted.  Advised Ms. Araceli Bouche (pt wife) of this information - she verbalized understanding.

## 2022-04-07 NOTE — ED Provider Triage Note (Signed)
Emergency Medicine Provider Triage Evaluation Note  Jonathan Ray , a 66 y.o. male  was evaluated in triage.  Pt complains of abnormal lab. Patient reports that he was seen by his primary care doctor this morning was noted to have an extremely low sodium level and was told to come to the emergency department.  He says over the past week or 2 he has had worsening lower extremity swelling as well as swelling in his abdomen.  He is not urinating as much is normal.  He has a Foley catheter in place.  He has stage IV metastatic bladder cancer.  Ever since he has been here he has noticed some right flank pain that is mild to moderate in intensity. Review of Systems  Positive:  Negative:   Physical Exam  BP (!) 126/105 (BP Location: Left Arm)   Pulse 80   Temp (!) 97.5 F (36.4 C) (Oral)   Resp 16   SpO2 100%  Gen:   Awake, no distress   Resp:  Normal effort  MSK:   Moves extremities without difficulty  Other:  3+ pitting edema bilaterally. Distended abdomen. Foley catheter with minimal amt. Green appearance.   Medical Decision Making  Medically screening exam initiated at 12:43 PM.  Appropriate orders placed.  Jonathan Ray was informed that the remainder of the evaluation will be completed by another provider, this initial triage assessment does not replace that evaluation, and the importance of remaining in the ED until their evaluation is complete.     Adolphus Birchwood, [place] 04/07/22 1244
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
## 2022-04-07 NOTE — Progress Notes (Signed)
PCCM Progress Note  Received call for possible consulted from Dr. Jeanell Sparrow. Patient is being admitted for severe hyponatremia with sodium 117. Per EDP patients wife reports some mild confusion but no other neurological symptoms including no seizures.   I discussed patients clinical picture with Dr. Chase Caller who feels at this time that patients hyponatremia can be managed with gentle IV hydration with NS and close monitoring in the stepdown unit under the care of [facility]. This was relayed to Dr. Jeanell Sparrow.   Please re-consult if further critical care is needed   Jonathan Ray D. Kenton Kingfisher, NP-C [facility] Personal contact information can be found on Amion  04/07/2022, 2:47 PM

## 2022-04-07 NOTE — ED Notes (Signed)
Pt ambulatory to bathroom

## 2022-04-07 NOTE — Progress Notes (Signed)
Holding 3% sodium gtt until pending sodium results per Johnney Ou MD. Will pass on to night shift RN that if Na is lower or the same then start it, but if it is higher then to hold off on the gtt.

## 2022-04-07 NOTE — ED Provider Notes (Signed)
[facility] DEPT Provider Note   CSN: [id number] Arrival date & time: 04/07/22  1218     History  Chief Complaint  Patient presents with   Abnormal Lab    Jonathan Ray is a 66 y.o. male.  HPI 66 yo male ho bladder ca with known fistula presents today with report of hyponatremia.  Patient seen by pmd this am and had labs.  Wife called and told he had critical sodium and to come to ED.  Wife reports confusion and weakness since d/c.  Foley catheter without output since loc. 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 Palliative care consult, and diarrhea thought to be due to [place],  Patient's sodium was 125 on admission but corrected to 138 prior to d/c    Home Medications Prior to Admission medications   Medication Sig Start Date End Date Taking? Authorizing Provider  cephALEXin (KEFLEX) 500 MG capsule Take 1 capsule (500 mg total) by mouth 2 (two) times daily. 04/01/22 05/01/22 Yes Florencia Reasons, MD  diazepam (VALIUM) 10 MG tablet Take 1 tablet (10 mg total) by mouth every 12 (twelve) hours as needed for anxiety. 04/01/22  Yes Lane Hacker L, DO  lidocaine-prilocaine (EMLA) cream Apply 1 application. topically as needed. 01/01/22  Yes [name], MD  loperamide (IMODIUM) 2 MG capsule Take 4 mg by mouth daily as needed for diarrhea or loose stools.   Yes [provider]  methadone (DOLOPHINE) 5 MG tablet Take 1 tablet (5 mg total) by mouth every 12 (twelve) hours. 04/01/22  Yes Acquanetta Chain, DO  metroNIDAZOLE (FLAGYL) 500 MG tablet Take 1 tablet (500 mg total) by mouth 2 (two) times daily. 04/01/22 05/01/22 Yes Florencia Reasons, MD  oxybutynin (DITROPAN) 5 MG tablet Take 5 mg by mouth every 8 (eight) hours as needed for bladder spasms. 04/05/22  Yes [provider]  polyethylene glycol (MIRALAX / GLYCOLAX) 17 g packet  Take 17 g by mouth daily as needed for mild constipation. 04/01/22  Yes Acquanetta Chain, DO  tamsulosin (FLOMAX) 0.4 MG CAPS capsule Take 0.4 mg by mouth daily. 02/22/22  Yes [provider]  HYDROmorphone (DILAUDID) 2 MG tablet Take 1 tablet (2 mg total) by mouth every 6 (six) hours as needed for severe pain. Patient not taking: Reported on 04/07/2022 04/02/22   Acquanetta Chain, DO  Sodium Chloride Flush (NORMAL SALINE FLUSH) 0.9 % SOLN Inject 10-40 mls by intracatheter route as needed 04/01/22   Florencia Reasons, MD  sodium chloride flush (NS) 0.9 % SOLN 10-40 mLs by Intracatheter route as needed (flush). 04/01/22   Florencia Reasons, MD      Allergies    Penicillins    Review of Systems   Review of Systems  Constitutional:  Positive for fatigue. Negative for appetite change and fever.  HENT: Negative.    Respiratory:  Negative for cough.   Cardiovascular:  Negative for chest pain.  Gastrointestinal:  Positive for abdominal distention.  Musculoskeletal:  Positive for back pain.    Physical Exam Updated Vital Signs BP (!) 143/78 (BP Location: Right Arm)   Pulse 77   Temp (!) 97.5 F (36.4 C) (Oral)   Resp 17   SpO2 100%  Physical Exam Vitals and nursing note reviewed.  Constitutional:      Appearance: Normal appearance.  HENT:     Head: Normocephalic and atraumatic.  Right Ear: External ear normal.     Left Ear: External ear normal.     Nose: Nose normal.     Mouth/Throat:     Pharynx: Oropharynx is clear.  Eyes:     Pupils: Pupils are equal, round, and reactive to light.  Cardiovascular:     Rate and Rhythm: Normal rate and regular rhythm.     Pulses: Normal pulses.  Pulmonary:     Effort: Pulmonary effort is normal.  Abdominal:     General: Abdomen is flat. There is distension.     Tenderness: There is no abdominal tenderness. There is no guarding.  Musculoskeletal:        General: Normal range of motion.     Cervical back: Normal range of motion.  Skin:     General: Skin is warm.     Capillary Refill: Capillary refill takes less than 2 seconds.  Neurological:     General: No focal deficit present.     Mental Status: He is alert.  Psychiatric:        Mood and Affect: Mood normal.     ED Results / Procedures / Treatments   Labs (all labs ordered are listed, but only abnormal results are displayed) Labs Reviewed  COMPREHENSIVE METABOLIC PANEL - Abnormal; Notable for the following components:      Result Value   Sodium 117 (*)    Potassium 3.3 (*)    Chloride 80 (*)    Calcium 8.0 (*)    Total Protein 6.3 (*)    All other components within normal limits  CBC WITH DIFFERENTIAL/PLATELET - Abnormal; Notable for the following components:   RBC 3.42 (*)    Hemoglobin 10.2 (*)    HCT 29.0 (*)    All other components within normal limits  SODIUM - Abnormal; Notable for the following components:   Sodium 120 (*)    All other components within normal limits  LIPASE, BLOOD  BRAIN NATRIURETIC PEPTIDE  URINALYSIS, ROUTINE W REFLEX MICROSCOPIC  SODIUM  SODIUM  SODIUM  OSMOLALITY  OSMOLALITY, URINE  SODIUM, URINE, RANDOM    EKG None  Radiology DG Chest 2 View  Result Date: 04/07/2022 CLINICAL DATA:  Hyponatremia, anasarca EXAM: CHEST - 2 VIEW COMPARISON:  Chest radiograph 11/13/2018 FINDINGS: There is a right chest wall port in place with the tip in the lower SVC. The cardiomediastinal silhouette is normal. There is minimal bibasilar subsegmental atelectasis. There is no focal consolidation or pulmonary edema. There is no pleural effusion or pneumothorax There is no acute osseous abnormality. IMPRESSION: Minimal bibasilar subsegmental atelectasis. Otherwise, no radiographic evidence of acute cardiopulmonary process. Electronically Signed   By: Valetta Mole M.D.   On: 04/07/2022 15:05    Procedures .Critical Care  Performed by: Pattricia Boss, MD Authorized by: Pattricia Boss, MD   Critical care provider statement:    Critical care  time (minutes):  60   Critical care was necessary to treat or prevent imminent or life-threatening deterioration of the following conditions:  Metabolic crisis   Critical care was time spent personally by me on the following activities:  Development of treatment plan with patient or surrogate, discussions with consultants, evaluation of patient's response to treatment, examination of patient, ordering and review of laboratory studies, ordering and review of radiographic studies, ordering and performing treatments and interventions, pulse oximetry, re-evaluation of patient's condition and review of old charts     Medications Ordered in ED Medications  sodium chloride 0.9 % bolus 500  mL (0 mLs Intravenous Stopped 04/07/22 1528)    ED Course/ Medical Decision Making/ A&P                           Medical Decision Making 66 yo male with complicated medical ho presents today with hyponatremia, increased confusion per wife, no seizures. Here sodium 117.   NS infusing Discussed hypertonic saline with critical care and they will Louie Casa, NP, Dr. Chase Caller does not advise hypertonic saline at this time Plan to continue hydration with normal saline Consult to medicine for stepdown admission Care discussed with Dr. Posey Pronto who will see for admission  Amount and/or Complexity of Data Reviewed Labs: ordered. Decision-making details documented in ED Course. Discussion of management or test interpretation with external provider(s): Discussed with Shirlee Limerick, on-call for critical care,  Risk Prescription drug management.           Final Clinical Impression(s) / ED Diagnoses Final diagnoses:  Hyponatremia    Rx / DC Orders ED Discharge Orders     None         Pattricia Boss, MD 04/07/22 1555

## 2022-04-07 NOTE — ED Notes (Signed)
Pt. Bladder scanned at this time, tolerated well with 148m noted

## 2022-04-07 NOTE — ED Notes (Signed)
Foley cath irrigated per MD order, well tolerated with urinary return noted.

## 2022-04-08 ENCOUNTER — Ambulatory Visit: Payer: 59

## 2022-04-08 ENCOUNTER — Ambulatory Visit: Payer: 59 | Admitting: Oncology

## 2022-04-08 ENCOUNTER — Other Ambulatory Visit: Payer: 59

## 2022-04-08 DIAGNOSIS — E871 Hypo-osmolality and hyponatremia: Secondary | ICD-10-CM

## 2022-04-08 DIAGNOSIS — E876 Hypokalemia: Secondary | ICD-10-CM

## 2022-04-08 LAB — BASIC METABOLIC PANEL
Anion gap: 10 (ref 5–15)
BUN: 20 mg/dL (ref 8–23)
CO2: 24 mmol/L (ref 22–32)
Calcium: 8.2 mg/dL — ABNORMAL LOW (ref 8.9–10.3)
Chloride: 93 mmol/L — ABNORMAL LOW (ref 98–111)
Creatinine, Ser: 0.83 mg/dL (ref 0.61–1.24)
GFR, Estimated: >60 mL/min (ref >60)
Glucose, Bld: 69 mg/dL — ABNORMAL LOW (ref 70–99)
Potassium: 3.4 mmol/L — ABNORMAL LOW (ref 3.5–5.1)
Sodium: 127 mmol/L — ABNORMAL LOW (ref 135–145)

## 2022-04-08 LAB — SODIUM
Sodium: 126 mmol/L — ABNORMAL LOW (ref 135–145)
Sodium: 125 mmol/L — ABNORMAL LOW (ref 135–145)
Sodium: 130 mmol/L — ABNORMAL LOW (ref 135–145)
Sodium: 134 mmol/L — ABNORMAL LOW (ref 135–145)
Sodium: 133 mmol/L — ABNORMAL LOW (ref 135–145)

## 2022-04-08 LAB — CBC
HCT: 25.8 % — ABNORMAL LOW (ref 39.0–52.0)
Hemoglobin: 9.2 g/dL — ABNORMAL LOW (ref 13.0–17.0)
MCH: 30.4 pg (ref 26.0–34.0)
MCHC: 35.7 g/dL (ref 30.0–36.0)
MCV: 85.1 fL (ref 80.0–100.0)
Platelets: 217 10*3/uL (ref 150–400)
RBC: 3.03 MIL/uL — ABNORMAL LOW (ref 4.22–5.81)
RDW: 15.1 % (ref 11.5–15.5)
WBC: 5 10*3/uL (ref 4.0–10.5)
nRBC: 0 % (ref 0.0–0.2)

## 2022-04-08 LAB — OSMOLALITY: Osmolality: 242 mOsm/kg — CL (ref 275–295)

## 2022-04-08 MED ORDER — POTASSIUM CHLORIDE CRYS ER 20 MEQ PO TBCR
40.0000 meq | EXTENDED_RELEASE_TABLET | Freq: Once | ORAL | Status: AC
  Administered 2022-04-08: 40 meq via ORAL
  Filled 2022-04-08: qty 2

## 2022-04-08 NOTE — Progress Notes (Signed)
[facility] Progress Note  Subjective: Nurse this am got 2 L UOP after pulling the debris w/ suction out of the foley catheter. Similar thing I believe happened yesterday in the ED. Na+ corrected itself up to 127 without any 3% saline yesterday.   Vitals:   04/08/22 0807 04/08/22 0900 04/08/22 1000 04/08/22 1145  BP:  (!) 114/51 119/65   Pulse:  79 91   Resp:  16 (!) 23   Temp: 98.5 F (36.9 C)   98.1 F (36.7 C)  TempSrc: Oral   Oral  SpO2:  96% 96%     Exam: Gen alert, no distress No rash, cyanosis or gangrene Sclera anicteric, throat clear  No jvd or bruits Chest clear bilat to bases, no rales/ wheezing RRR no MRG Abd soft ntnd no mass or ascites +bs GU large bore foley in place, draining clear greenish urine MS no joint effusions or deformity Ext diffuse 2+ bilat pitting pretib edema, no UE or hip edema, no wounds or ulcers Neuro is alert, Ox 3 , nf     Home meds include - diazepam, methadone, metronidazole, oxybutynin, tamsulosin, hydromorphone, prns      Na 117  K 3.3  CO2 26  BUN 10  CReat 0.70  Ca 9.1  Alb 3.7  LFT's okay  WBC 5K Hb 10.2     CXR - IMPRESSION: Minimal bibasilar subsegmental atelectasis. Otherwise, no radiographic evidence of acute cardiopulmonary process.     UNa 31, UOsm - pending     Assessment/ Plan: Hyponatremia - in patient w/ advanced stage bladder cancer. Has new onset LE edema. UNa 31, UOsm 127. I don't think this is SIADH. The urine osm is low and since admission he has dumped >4L or urine w/o any diuretics, and the serum Na+m has corrected itself (w/o any 3%). He is having recurrent foley obstruction, likely due to the vesicocolonic fistula bringing excess GI tract debris into the GU system. This am his current RN suctioned the foley and returned 2 L urine out immediately, she said flushing didn't help. I believe there may have been a similar situation in the ED yesterday, though not documented.  His vol overload is likely due to  the same issue. Would discuss w/ his urologist how best to approach this. He is only mildly symptomatic when this happens  Otherwise Na+ is stable this am, no further suggestions. Have d/w pmd. Will sign off.   LE edema - CXR neg, BNP is low, suspect vol overload due to recurrent bladder outlet obstruction. See above.  Bladder cancer - w/ mets, did not respond well to chemoRx. Dx was in 2021. Recently admitted for vesicocolonic fistula, treated w/ large bore indwelling foley. Prognosis guarded. COPD Anemia - likely d/t malignancy/ chronic disease, Hb 9-10. Per pmd.  Hx testicular cancer - rx'd in 2001     Rob Vanshika Jastrzebski 04/08/2022, 12:48 PM   Recent Labs  Lab 04/07/22 1257 04/08/22 0429  HGB 10.2* 9.2*  ALBUMIN 3.7  --   CALCIUM 8.0* 8.2*  CREATININE 0.91 0.83  K 3.3* 3.4*   No results for input(s): "IRON", "TIBC", "FERRITIN" in the last 168 hours.  Invalid input(s): "SATURATION RATIO" Inpatient medications:  cephALEXin  500 mg Oral BID   Chlorhexidine Gluconate Cloth  6 each Topical Daily   enoxaparin (LOVENOX) injection  40 mg Subcutaneous QHS   methadone  5 mg Oral Q12H   metroNIDAZOLE  500 mg Oral BID   tamsulosin  0.4 mg Oral  Daily    diazepam, oxybutynin

## 2022-04-08 NOTE — Progress Notes (Addendum)
Upon RN 0800 assessment pt complains of full bladder feeling. Only 150 output last night. Bladder scan reading 163. RN flushed and pulled back several times & pulled out several chunks of sediment like material. Within the next hour 1850 mL emptied from foley.  Nephrology & MD aware   Around 1200 pt complained of bladder pain again. This RN flushed and pulled back about 10-15 times & got several chunks of sediment like material & about 500 mL out.

## 2022-04-08 NOTE — Progress Notes (Addendum)
PROGRESS NOTE    Jonathan Ray  AUQ:[id number] DOB: August 30, 1956 DOA: 04/07/2022 PCP: [name], DO   Brief Narrative:  HPI: Jonathan Ray is a 66 y.o. 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   History mostly obtained from patient.  Patient reports swelling in his lower extremity that began yesterday, wife reports it was couple days ago.  Has not noticed any shortness of breath, orthopnea, or PND.  He reports no history of heart failure.  He reports no diarrhea.  Reports his Foley has not put out anything since this morning.  Has been water and eating okay at home.  He reports some confusion, especially with short-term memory that is worse than normal.  He reports feeling unbalanced as well.  No seizures.No fevers, chills, chest pain, shortness of breath.  Given 500 cc in the ED with sodium 179 increased to 120.  Assessment & Plan:   Principal Problem:   Acute hyponatremia Active Problems:   Acute diarrhea   Malignant tumor of urinary bladder (HCC)   Bladder mass   Hypokalemia  Acute hyponatremia: Presented with mild confusion which was thought to be secondary to acute hyponatremia.  Currently fully alert and oriented.  He has no complaints.  He presented with sodium of 117 which improved to 127.  Nephrology had been consulted and they started him on hypertonic saline  for brief period of time.  This is being considered secondary to SIADH.  TSH normal.  Will defer further management to nephrology.  Addendum: Received a call from Dr. Jonnie Finner of nephrology.  His comments as below. I don't think this is SIADH. The urine osm is low and since admission he has dumped >4L or urine w/o any diuretics, and the serum Na+m has corrected itself (w/o any 3%). He is having recurrent foley obstruction, likely due to the vesicocolonic fistula bringing  excess GI tract debris into the GU system. This am his current RN suctioned the foley and returned 2 L urine out immediately, she said flushing didn't help. I believe there may have been a similar situation in the ED yesterday, though not documented.  His vol overload is likely due to the same issue.  Per his request, I have consulted urology and spoke to Dr. Lovena Neighbours.  Mild hypokalemia: We will replace.   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  Foley was irrigated in the ED and is flushing appropriately. -Resumed Keflex 500 mg twice daily, Flagyl 100 mg twice daily --Patient was supposed to be a palliative care yesterday, they will see him while he is hospitalized.  I have placed consult.   Chronic pain Anxiety Continue home diazepam 10 mg as needed every 12 hours Continue methadone 5 mg every 12 hours  Anemia- at baseline  COPD- no evidence of acute exacerbation  Diarrhea: Patient has been having diarrhea for last 2-3 days.  Will check C. difficile.   Anemia of chronic disease: Hemoglobin at baseline appears to be around 9.5.  Currently 9.2.  Monitor.  DVT prophylaxis: enoxaparin (LOVENOX) injection 40 mg Start: 04/07/22 2200 Place TED hose Start: 04/07/22 1811   Code Status: DNR  Family Communication:  None present at bedside.  Plan of care discussed with patient in length and he/she verbalized  understanding and agreed with it.  Status is: Inpatient Remains inpatient appropriate because: Recovering from hyponatremia.   Estimated body mass index is 23.44 kg/m as calculated from the following:   Height as of 03/29/22: '6\' 1"'$  (1.854 m).   Weight as of 04/01/22: 80.6 kg.    Nutritional Assessment: There is no height or weight on file to calculate BMI.. Seen by dietician.  I agree with  the assessment and plan as outlined below: Nutrition Status:        . Skin Assessment: I have examined the patient's skin and I agree with the wound assessment as performed by the wound care RN as outlined below:    Consultants:  Nephrology  Procedures:  None  Antimicrobials:  Anti-infectives (From admission, onward)    Start     Dose/Rate Route Frequency Ordered Stop   04/07/22 2200  cephALEXin (KEFLEX) capsule 500 mg        500 mg Oral 2 times daily 04/07/22 1810     04/07/22 2200  metroNIDAZOLE (FLAGYL) tablet 500 mg        500 mg Oral 2 times daily 04/07/22 1810           Subjective: Patient seen and examined.  He has no complaints but he is just a little concerned about left lower extremity edema which appears to be only +1 on my examination.  Objective: Vitals:   04/08/22 0800 04/08/22 0807 04/08/22 0900 04/08/22 1000  BP: 136/60  (!) 114/51 119/65  Pulse: 82  79 91  Resp: 12  16 (!) 23  Temp:  98.5 F (36.9 C)    TempSrc:  Oral    SpO2: 96%  96% 96%    Intake/Output Summary (Last 24 hours) at 04/08/2022 1031 Last data filed at 04/08/2022 0900 Gross per 24 hour  Intake 560 ml  Output 4450 ml  Net -3890 ml   There were no vitals filed for this visit.  Examination:  General exam: Appears calm and comfortable  Respiratory system: Clear to auscultation. Respiratory effort normal. Cardiovascular system: S1 & S2 heard, RRR. No JVD, murmurs, rubs, gallops or clicks.  +1 pitting edema left lower extremity. Gastrointestinal system: Abdomen is nondistended, soft and nontender. No organomegaly or masses felt. Normal bowel sounds heard. Central nervous system: Alert and oriented. No focal neurological deficits. Extremities: Symmetric 5 x 5 power. Skin: No rashes, lesions or ulcers Psychiatry: Judgement and insight appear normal. Mood & affect appropriate.   Data Reviewed: I have personally reviewed following labs and imaging studies  CBC: Recent Labs  Lab  04/07/22 1257 04/08/22 0429  WBC 5.9 5.0  NEUTROABS 4.4  --   HGB 10.2* 9.2*  HCT 29.0* 25.8*  MCV 84.8 85.1  PLT 238 924   Basic Metabolic Panel: Recent Labs  Lab 04/07/22 1257 04/07/22 1413 04/07/22 2013 04/08/22 0033 04/08/22 0429  NA 117* 120* 123* 126* 127*  K 3.3*  --   --   --  3.4*  CL 80*  --   --   --  93*  CO2 24  --   --   --  24  GLUCOSE 95  --   --   --  69*  BUN 19  --   --   --  20  CREATININE 0.91  --   --   --  0.83  CALCIUM 8.0*  --   --   --  8.2*   GFR: Estimated Creatinine Clearance: 101.6 mL/min (by C-G formula  based on SCr of 0.83 mg/dL). Liver Function Tests: Recent Labs  Lab 04/07/22 1257  AST 25  ALT 18  ALKPHOS 49  BILITOT 0.9  PROT 6.3*  ALBUMIN 3.7   Recent Labs  Lab 04/07/22 1257  LIPASE 25   No results for input(s): "AMMONIA" in the last 168 hours. Coagulation Profile: No results for input(s): "INR", "PROTIME" in the last 168 hours. Cardiac Enzymes: No results for input(s): "CKTOTAL", "CKMB", "CKMBINDEX", "TROPONINI" in the last 168 hours. BNP (last 3 results) No results for input(s): "PROBNP" in the last 8760 hours. HbA1C: No results for input(s): "HGBA1C" in the last 72 hours. CBG: Recent Labs  Lab 04/07/22 1920  GLUCAP 88   Lipid Profile: No results for input(s): "CHOL", "HDL", "LDLCALC", "TRIG", "CHOLHDL", "LDLDIRECT" in the last 72 hours. Thyroid Function Tests: Recent Labs    04/07/22 1838  TSH 0.214*   Anemia Panel: No results for input(s): "VITAMINB12", "FOLATE", "FERRITIN", "TIBC", "IRON", "RETICCTPCT" in the last 72 hours. Sepsis Labs: No results for input(s): "PROCALCITON", "LATICACIDVEN" in the last 168 hours.  Recent Results (from the past 240 hour(s))  Urine Culture     Status: Abnormal   Collection Time: 03/29/22  9:58 PM   Specimen: Urine, Clean Catch  Result Value Ref Range Status   Specimen Description   Final    URINE, CLEAN CATCH Performed at [facility], [place]  [address] [place], [place] [address]    Special Requests   Final    NONE Performed at [facility], [place] [address] [place], [place] [address]    Culture >=100,000 COLONIES/mL ESCHERICHIA COLI (A)  Final   Report Status 03/31/2022 FINAL  Final   Organism ID, Bacteria ESCHERICHIA COLI (A)  Final      Susceptibility   Escherichia coli - MIC*    AMPICILLIN <=2 SENSITIVE Sensitive     CEFAZOLIN <=4 SENSITIVE Sensitive     CEFEPIME <=0.12 SENSITIVE Sensitive     CEFTRIAXONE <=0.25 SENSITIVE Sensitive     CIPROFLOXACIN <=0.25 SENSITIVE Sensitive     GENTAMICIN <=1 SENSITIVE Sensitive     IMIPENEM <=0.25 SENSITIVE Sensitive     NITROFURANTOIN <=16 SENSITIVE Sensitive     TRIMETH/SULFA <=20 SENSITIVE Sensitive     AMPICILLIN/SULBACTAM <=2 SENSITIVE Sensitive     PIP/TAZO <=4 SENSITIVE Sensitive     * >=100,000 COLONIES/mL ESCHERICHIA COLI  C Difficile Quick Screen w PCR reflex     Status: None   Collection Time: 03/29/22 10:14 PM   Specimen: STOOL  Result Value Ref Range Status   C Diff antigen NEGATIVE NEGATIVE Final   C Diff toxin NEGATIVE NEGATIVE Final   C Diff interpretation No C. difficile detected.  Final    Comment: Performed at [facility], [place] [address], [place], [place] [address]  Stool culture     Status: None   Collection Time: 03/29/22 10:14 PM   Specimen: STOOL  Result Value Ref Range Status   Salmonella/Shigella Screen Final report  Final   Campylobacter Culture Final report  Final   E coli, Shiga toxin Assay Negative Negative Final    Comment: (NOTE) Performed At: [facility] [place], [place] [id number] Rush Farmer MD KD:[id number]   STOOL CULTURE REFLEX - RSASHR     Status: None   Collection Time: 03/29/22 10:14 PM  Result Value Ref Range Status   Stool Culture result 1 (RSASHR) Comment  Final    Comment: (NOTE) No Salmonella or  Shigella recovered. Performed At: [facility] [address] [place], [place] [id number] Rush Farmer MD QI:[id number]   STOOL CULTURE Reflex - CMPCXR     Status: None   Collection Time: 03/29/22 10:14 PM  Result Value Ref Range Status   Stool Culture result 1 (CMPCXR) Comment  Final    Comment: (NOTE) No Campylobacter species isolated. Performed At: [facility] [place], [place] [id number] Rush Farmer MD JJ:[id number]   Blood culture (routine x 2)     Status: None   Collection Time: 03/29/22 10:23 PM   Specimen: BLOOD RIGHT FOREARM  Result Value Ref Range Status   Specimen Description   Final    BLOOD RIGHT FOREARM Performed at [place] [address] [place], [place] [address]    Special Requests   Final    BOTTLES DRAWN AEROBIC AND ANAEROBIC Blood Culture adequate volume Performed at [place] [address], [place], [place] [address]    Culture   Final    NO GROWTH 5 DAYS Performed at [facility], [place] [address], [place], [place] [address]    Report Status 04/03/2022 FINAL  Final  Blood culture (routine x 2)     Status: None   Collection Time: 03/29/22 10:23 PM   Specimen: BLOOD  Result Value Ref Range Status   Specimen Description   Final    BLOOD LEFT ANTECUBITAL Performed at [place] [address], [place], [place] [address]    Special Requests   Final    BOTTLES DRAWN AEROBIC AND ANAEROBIC Blood Culture adequate volume Performed at [place] [address] [place], [place] [address]    Culture   Final    NO GROWTH 5 DAYS Performed at [facility], [place] [address] [place] [address], [place], [place] [address]    Report Status 04/03/2022 FINAL  Final  Stool culture     Status: None   Collection Time: 03/30/22  2:45 AM  Result Value Ref Range Status   Salmonella/Shigella Screen Final report  Final   Campylobacter Culture Final report  Final   E coli, Shiga toxin Assay Negative Negative Final     Comment: (NOTE) Performed At: [facility] [place], [place] [id number] Rush Farmer MD PX:[id number]   Gastrointestinal Panel by PCR , Stool     Status: None   Collection Time: 03/30/22  2:45 AM   Specimen: Stool  Result Value Ref Range Status   Campylobacter species NOT DETECTED NOT DETECTED Final   Plesimonas shigelloides NOT DETECTED NOT DETECTED Final   Salmonella species NOT DETECTED NOT DETECTED Final   Yersinia enterocolitica NOT DETECTED NOT DETECTED Final   Vibrio species NOT DETECTED NOT DETECTED Final   Vibrio cholerae NOT DETECTED NOT DETECTED Final   Enteroaggregative E coli (EAEC) NOT DETECTED NOT DETECTED Final   Enteropathogenic E coli (EPEC) NOT DETECTED NOT DETECTED Final   Enterotoxigenic E coli (ETEC) NOT DETECTED NOT DETECTED Final   Shiga like toxin producing E coli (STEC) NOT DETECTED NOT DETECTED Final   Shigella/Enteroinvasive E coli (EIEC) NOT DETECTED NOT DETECTED Final   Cryptosporidium NOT DETECTED NOT DETECTED Final   Cyclospora cayetanensis NOT DETECTED NOT DETECTED Final   Entamoeba histolytica NOT DETECTED NOT DETECTED Final   Giardia lamblia NOT DETECTED NOT DETECTED Final   Adenovirus F40/41 NOT DETECTED NOT DETECTED Final   Astrovirus NOT DETECTED NOT DETECTED Final   Norovirus GI/GII NOT  DETECTED NOT DETECTED Final   Rotavirus A NOT DETECTED NOT DETECTED Final   Sapovirus (I, II, IV, and V) NOT DETECTED NOT DETECTED Final    Comment: Performed at [facility], [address] [place], [place] [address]  STOOL CULTURE REFLEX - RSASHR     Status: None   Collection Time: 03/30/22  2:45 AM  Result Value Ref Range Status   Stool Culture result 1 (RSASHR) Comment  Final    Comment: (NOTE) No Salmonella or Shigella recovered. Performed At: [facility] [address] [place], [place] [id number] Rush Farmer MD IO:[id number]   STOOL CULTURE Reflex - CMPCXR     Status: None   Collection Time:  03/30/22  2:45 AM  Result Value Ref Range Status   Stool Culture result 1 (CMPCXR) Comment  Final    Comment: (NOTE) No Campylobacter species isolated. Performed At: [facility] [place], [place] [id number] Rush Farmer MD RC:[id number]   MRSA Next Gen by PCR, Nasal     Status: None   Collection Time: 04/07/22  6:04 PM   Specimen: Nasal Mucosa; Nasal Swab  Result Value Ref Range Status   MRSA by PCR Next Gen NOT DETECTED NOT DETECTED Final    Comment: (NOTE) The GeneXpert MRSA Assay (FDA approved for NASAL specimens only), is one component of a comprehensive MRSA colonization surveillance program. It is not intended to diagnose MRSA infection nor to guide or monitor treatment for MRSA infections. Test performance is not FDA approved in patients less than 36 years old. Performed at [facility], [place] [address], [place], [place] [address]      Radiology Studies: DG Chest 2 View  Result Date: 04/07/2022 CLINICAL DATA:  Hyponatremia, anasarca EXAM: CHEST - 2 VIEW COMPARISON:  Chest radiograph 11/13/2018 FINDINGS: There is a right chest wall port in place with the tip in the lower SVC. The cardiomediastinal silhouette is normal. There is minimal bibasilar subsegmental atelectasis. There is no focal consolidation or pulmonary edema. There is no pleural effusion or pneumothorax There is no acute osseous abnormality. IMPRESSION: Minimal bibasilar subsegmental atelectasis. Otherwise, no radiographic evidence of acute cardiopulmonary process. Electronically Signed   By: Valetta Mole M.D.   On: 04/07/2022 15:05    Scheduled Meds:  cephALEXin  500 mg Oral BID   Chlorhexidine Gluconate Cloth  6 each Topical Daily   enoxaparin (LOVENOX) injection  40 mg Subcutaneous QHS   methadone  5 mg Oral Q12H   metroNIDAZOLE  500 mg Oral BID   tamsulosin  0.4 mg Oral Daily   Continuous Infusions:   LOS: 1 day   Darliss Cheney, MD [facility]  04/08/2022, 10:31 AM   *Please note that this is a verbal dictation therefore any spelling or grammatical errors are due to the "[place]" system interpretation.  Please page via [place] and do not message via secure chat for urgent patient care matters. Secure chat can be used for non urgent patient care matters.  How to contact the [facility] Attending or Consulting provider [place] or covering provider during after hours [place], for this patient?  Check the care team in [facility] and look for a) attending/consulting [facility] provider listed and b) the [facility] team listed. Page or secure chat [phone]. Log into [email or web address] and use [place]'s universal password to access. If you do not have the password, please contact the hospital operator. Locate the [facility] provider you are looking for under [facility]  and page to a number that you can be directly reached. If you still have difficulty reaching the provider, please page the [facility] (Director on Call) for the Hospitalists listed on amion for assistance.

## 2022-04-08 NOTE — Progress Notes (Signed)
[facility] lab called critical serum osmolarity 242. NP aware, notified will review lab.
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
## 2022-04-08 NOTE — Progress Notes (Signed)
Transition of Care [facility]) Screening Note  Patient Details  Name: Jonathan Ray Date of Birth: 10-14-1955  Transition of Care [facility]) CM/SW Contact:    Sherie Don, LCSW Phone Number: 04/08/2022, 11:05 AM  Transition of Care Department [facility]) has reviewed patient and no TOC needs have been identified at this time. We will continue to monitor patient advancement through interdisciplinary progression rounds. If new patient transition needs arise, please place a TOC consult.

## 2022-04-09 ENCOUNTER — Encounter (HOSPITAL_COMMUNITY): Payer: Self-pay

## 2022-04-09 ENCOUNTER — Other Ambulatory Visit: Payer: Self-pay

## 2022-04-09 ENCOUNTER — Emergency Department (HOSPITAL_COMMUNITY)
Admission: EM | Admit: 2022-04-09 | Discharge: 2022-04-09 | Disposition: A | Discharge status: Home / Self Care | Payer: 59 | Attending: Emergency Medicine | Admitting: Emergency Medicine

## 2022-04-09 ENCOUNTER — Telehealth: Payer: Self-pay

## 2022-04-09 DIAGNOSIS — T83091A Other mechanical complication of indwelling urethral catheter, initial encounter: Secondary | ICD-10-CM | POA: Insufficient documentation

## 2022-04-09 DIAGNOSIS — R7989 Other specified abnormal findings of blood chemistry: Secondary | ICD-10-CM | POA: Insufficient documentation

## 2022-04-09 DIAGNOSIS — C679 Malignant neoplasm of bladder, unspecified: Secondary | ICD-10-CM

## 2022-04-09 DIAGNOSIS — E871 Hypo-osmolality and hyponatremia: Secondary | ICD-10-CM | POA: Diagnosis not present

## 2022-04-09 DIAGNOSIS — T83098A Other mechanical complication of other indwelling urethral catheter, initial encounter: Secondary | ICD-10-CM | POA: Diagnosis not present

## 2022-04-09 LAB — CBC WITH DIFFERENTIAL/PLATELET
Abs Immature Granulocytes: 0.03 10*3/uL (ref 0.00–0.07)
Basophils Absolute: 0 10*3/uL (ref 0.0–0.1)
Basophils Relative: 1 %
Eosinophils Absolute: 0 10*3/uL (ref 0.0–0.5)
Eosinophils Relative: 1 %
HCT: 29.7 % — ABNORMAL LOW (ref 39.0–52.0)
Hemoglobin: 10.2 g/dL — ABNORMAL LOW (ref 13.0–17.0)
Immature Granulocytes: 1 %
Lymphocytes Relative: 14 %
Lymphs Abs: 0.8 10*3/uL (ref 0.7–4.0)
MCH: 30 pg (ref 26.0–34.0)
MCHC: 34.3 g/dL (ref 30.0–36.0)
MCV: 87.4 fL (ref 80.0–100.0)
Monocytes Absolute: 0.7 10*3/uL (ref 0.1–1.0)
Monocytes Relative: 11 %
Neutro Abs: 4.3 10*3/uL (ref 1.7–7.7)
Neutrophils Relative %: 72 %
Platelets: 324 10*3/uL (ref 150–400)
RBC: 3.4 MIL/uL — ABNORMAL LOW (ref 4.22–5.81)
RDW: 15.4 % (ref 11.5–15.5)
WBC: 5.9 10*3/uL (ref 4.0–10.5)
nRBC: 0 % (ref 0.0–0.2)

## 2022-04-09 LAB — SODIUM
Sodium: 130 mmol/L — ABNORMAL LOW (ref 135–145)
Sodium: 132 mmol/L — ABNORMAL LOW (ref 135–145)

## 2022-04-09 LAB — BASIC METABOLIC PANEL
Anion gap: 7 (ref 5–15)
Anion gap: 11 (ref 5–15)
BUN: 21 mg/dL (ref 8–23)
BUN: 26 mg/dL — ABNORMAL HIGH (ref 8–23)
CO2: 26 mmol/L (ref 22–32)
CO2: 24 mmol/L (ref 22–32)
Calcium: 8.6 mg/dL — ABNORMAL LOW (ref 8.9–10.3)
Calcium: 9.2 mg/dL (ref 8.9–10.3)
Chloride: 100 mmol/L (ref 98–111)
Chloride: 99 mmol/L (ref 98–111)
Creatinine, Ser: 1.05 mg/dL (ref 0.61–1.24)
Creatinine, Ser: 1.27 mg/dL — ABNORMAL HIGH (ref 0.61–1.24)
GFR, Estimated: >60 mL/min (ref >60)
GFR, Estimated: >60 mL/min (ref >60)
Glucose, Bld: 85 mg/dL (ref 70–99)
Glucose, Bld: 129 mg/dL — ABNORMAL HIGH (ref 70–99)
Potassium: 3.7 mmol/L (ref 3.5–5.1)
Potassium: 3.6 mmol/L (ref 3.5–5.1)
Sodium: 133 mmol/L — ABNORMAL LOW (ref 135–145)
Sodium: 134 mmol/L — ABNORMAL LOW (ref 135–145)

## 2022-04-09 MED ORDER — HEPARIN SOD (PORK) LOCK FLUSH 100 UNIT/ML IV SOLN
500.0000 [IU] | INTRAVENOUS | Status: AC | PRN
  Administered 2022-04-09: 500 [IU]
  Filled 2022-04-09: qty 5

## 2022-04-09 NOTE — Telephone Encounter (Signed)
Wife Blair Hailey called requesting referral to [facility] NP for palliative care. Patient would like to discontinue immunotherapy and would like to talk about hospice services. Referral placed to palliative care.  Referral to palliative care placed.

## 2022-04-09 NOTE — Discharge Instructions (Addendum)
Continue to flush the catheter AT LEAST 2 times a day with 150-200 cc of sterile water

## 2022-04-09 NOTE — ED Provider Notes (Signed)
[facility] DEPT Provider Note   CSN: [id number] Arrival date & time: 04/09/22  2008     History {Add pertinent medical, surgical, social history, OB history to HPI:1} Chief Complaint  Patient presents with   Foley Catheter Problem    Jonathan Ray is a 66 y.o. male.  HPI      "Jonathan Ray is a 66 y.o. male who is currently being treated for hyponatremia.  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 Louis Meckel in 2021 (currently receiving gem/cis chemotherapy with Dr. 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      The patient was seen in our office on 04/05/2022 due to catheter obstruction 2/2 feculent material.  His Foley catheter was exchanged and he was given instructions to irrigate his Foley catheter at home.  When questioned, the patient admits that he has not been irrigating his catheter at all.  66 year old male with metastatic UCC with subsequent development of entero-vesical fistula along with urinary retention secondary to catheter obstruction from feculent debris from entero-vesical fistula   -His 22 French Foley catheter needs to be hand irrigated at least twice a day with 150-200 mL of sterile water to clear debris from his catheter.  If current catheter becomes obstructed despite irrigation, upsize to a 24 [place] hematuria catheter and continue hand irrigation. "  Home Medications Prior to Admission medications   Medication Sig Start Date End Date Taking? Authorizing Provider  cephALEXin (KEFLEX) 500 MG capsule Take 1 capsule (500 mg total) by mouth 2 (two) times daily. 04/01/22 05/01/22  Florencia Reasons, MD  diazepam (VALIUM) 10 MG tablet Take 1 tablet (10 mg total) by mouth every 12 (twelve) hours as needed for anxiety. 04/01/22   Acquanetta Chain,  DO  lidocaine-prilocaine (EMLA) cream Apply 1 application. topically as needed. 01/01/22   [name], MD  loperamide (IMODIUM) 2 MG capsule Take 4 mg by mouth daily as needed for diarrhea or loose stools.    [provider]  methadone (DOLOPHINE) 5 MG tablet Take 1 tablet (5 mg total) by mouth every 12 (twelve) hours. 04/01/22   Acquanetta Chain, DO  metroNIDAZOLE (FLAGYL) 500 MG tablet Take 1 tablet (500 mg total) by mouth 2 (two) times daily. 04/01/22 05/01/22  Florencia Reasons, MD  oxybutynin (DITROPAN) 5 MG tablet Take 5 mg by mouth every 8 (eight) hours as needed for bladder spasms. 04/05/22   [provider]  polyethylene glycol (MIRALAX / GLYCOLAX) 17 g packet Take 17 g by mouth daily as needed for mild constipation. 04/01/22   Acquanetta Chain, DO  Sodium Chloride Flush (NORMAL SALINE FLUSH) 0.9 % SOLN Inject 10-40 mls by intracatheter route as needed 04/01/22   Florencia Reasons, MD  sodium chloride flush (NS) 0.9 % SOLN 10-40 mLs by Intracatheter route as needed (flush). 04/01/22   Florencia Reasons, MD  tamsulosin (FLOMAX) 0.4 MG CAPS capsule Take 0.4 mg by mouth daily. 02/22/22   [provider]      Allergies    Penicillins    Review of Systems   Review of Systems  Physical Exam Updated Vital Signs BP (!) 140/96 (BP Location: Left Arm)   Pulse (!) 120   Temp 98.7 F (37.1 C) (Oral)   Resp 18  SpO2 99%  Physical Exam  ED Results / Procedures / Treatments   Labs (all labs ordered are listed, but only abnormal results are displayed) Labs Reviewed  CBC WITH DIFFERENTIAL/PLATELET  BASIC METABOLIC PANEL    EKG None  Radiology No results found.  Procedures Procedures  {Document cardiac monitor, telemetry assessment procedure when appropriate:1}  Medications Ordered in ED Medications - No data to display  ED Course/ Medical Decision Making/ A&P                           Medical Decision Making  ***  {Document critical care time when  appropriate:1} {Document review of labs and clinical decision tools ie heart score, Chads2Vasc2 etc:1}  {Document your independent review of radiology images, and any outside records:1} {Document your discussion with family members, caretakers, and with consultants:1} {Document social determinants of health affecting pt's care:1} {Document your decision making why or why not admission, treatments were needed:1} Final Clinical Impression(s) / ED Diagnoses Final diagnoses:  None    Rx / DC Orders ED Discharge Orders     None

## 2022-04-09 NOTE — ED Triage Notes (Signed)
Patient just released from the hospital today. Has stage 4 bladder cancer. Had his foley catheter changed today. Michela Pitcher he thinks it is not draining properly. Drainage bag has sediment and greenish tinge to urine.

## 2022-04-09 NOTE — ED Provider Triage Note (Signed)
Emergency Medicine Provider Triage Evaluation Note  Jonathan Ray , a 66 y.o. male  was evaluated in triage. 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 Pt complains of dark urine and intermittent groin pain.  Stool/sediment noted in catheter tubing. No fever, vomiting.   Review of Systems  Positive: Groin pain Negative: Fever  Physical Exam  BP (!) 140/96 (BP Location: Left Arm)   Pulse (!) 120   Temp 98.7 F (37.1 C) (Oral)   Resp 18   SpO2 99%  Gen:   Awake, no distress   Resp:  Normal effort  MSK:   Moves extremities without difficulty  Other:    Medical Decision Making  Medically screening exam initiated at 8:39 PM.  Appropriate orders placed.  Jonathan Ray was informed that the remainder of the evaluation will be completed by another provider, this initial triage assessment does not replace that evaluation, and the importance of remaining in the ED until their evaluation is complete.     Carlisle Cater, PA-C 04/09/22 2040

## 2022-04-09 NOTE — ED Notes (Signed)
Irrigated foley. Foley draining easily at this time.

## 2022-04-09 NOTE — Discharge Summary (Signed)
PatientPhysician Discharge Summary  Jonathan Ray YNW:[id number] DOB: 11/15/1955 DOA: 04/07/2022  PCP: [name], DO  Admit date: 04/07/2022 Discharge date: 04/09/2022 30 Day Unplanned Readmission Risk Score    Flowsheet Row ED to Hosp-Admission (Current) from 04/07/2022 in [facility]/STEPDOWN  30 Day Unplanned Readmission Risk Score (%) 20.67 Filed at 04/09/2022 0801       This score is the patient's risk of an unplanned readmission within 30 days of being discharged (0 -100%). The score is based on dignosis, age, lab data, medications, orders, and past utilization.   Low:  0-14.9   Medium: 15-21.9   High: 22-29.9   Extreme: 30 and above          Admitted From: Home Disposition: Home  Recommendations for Outpatient Follow-up:  Follow up with PCP in 1-2 weeks Please obtain BMP/CBC in one week Follow-up with your primary urologist/Dr. Tresa Moore in 1 to 2 weeks Please follow up with your PCP on the following pending results: Unresulted Labs (From admission, onward)     Start     Ordered   04/08/22 0934  Sodium  Now then every 4 hours,   R (with TIMED occurrences)     Question:  Specimen collection method  Answer:  Unit=Unit collect   04/08/22 0933   04/08/22 0803  C Difficile Quick Screen w PCR reflex  (C Difficile quick screen w PCR reflex panel )  Once, for 24 hours,   TIMED       References:    CDiff Information Tool   04/08/22 0802   04/08/22 6578  Basic metabolic panel  Daily,   R      04/07/22 [place]: None Equipment/Devices: None  Discharge Condition: Stable CODE STATUS: DNR Diet recommendation: Cardiac  Subjective: Seen and examined.  He has no complaints.  He is ready to go home and he assured me that he knows how to flush his Foley catheter and his wife knows as well.  Brief/Interim Summary: Jonathan Ray is a 66 y.o. male with medical history significant of Vesicocolonic fistula/advanced bladder cancer on  chronic suppressive antibiotics, COPD, recent admission for hyponatremia presented to ED basically due to swelling of the bilateral lower extremities and was readmitted for hyponatremia.  He did not have any shortness of breath.  He already had indwelling Foley catheter which according to the family did not put out anything since the morning on admission.  No other complaints.  His sodium was 117 upon presentation.  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  Nephrology was consulted and they had initially thought that he would need hypertonic saline but that was not needed and patient's sodium improved on its own.  It was later found out that patient was having clogging of his Foley catheter due to fecal debris obstructing the catheter since he already has vesicocolonic fistula.  His Foley catheter needed to be flushed couple times and after that there was great amount of urine which came out, approximately 2 L.  For this reason, nephrology recommended urology consultation.  Patient was seen by Dr. Lovena Neighbours of urology who had recommended irrigating his Foley catheter twice at home and these instructions were given to him during recent urology outpatient visit as well.  Patient apparently was not following the recommendations and not irrigating the catheter at all.  Per Dr. Lovena Neighbours, if he continues  to follow recommendation with irrigating the Foley catheter twice a day with 150-200 mL of sterile water to clear the debris from the catheter he will likely not have any issue but if he does have issue then Foley catheter may need to be upsized to 24 [place].  Patient now understands all instructions given to him.  He knows how to do that and his wife does as well.  He is ready for discharge, sodium 133 today.  We will discharge him home.  Follow-up with urology.  Mild hypokalemia: We will replace.   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  Foley was irrigated in the ED and is flushing appropriately. -Resumed Keflex 500 mg twice daily, Flagyl 100 mg twice daily --Patient was supposed to be a palliative care yesterday, they will see him while he is hospitalized.  I have placed consult.   Chronic pain Anxiety Continue home diazepam 10 mg as needed every 12 hours Continue methadone 5 mg every 12 hours   Anemia- at baseline   COPD- no evidence of acute exacerbation   Diarrhea: Patient has been having diarrhea for last 2-3 days.  Will check C. difficile.   Anemia of chronic disease: Hemoglobin at baseline appears to be around 9.5.  Currently 9.2.  Monitor.  Discharge plan was discussed with patient and/or family member and they verbalized understanding and agreed with it.  Discharge Diagnoses:  Principal Problem:   Acute hyponatremia Active Problems:   Acute diarrhea   Malignant tumor of urinary bladder (HCC)   Bladder mass   Hypokalemia    Discharge Instructions   Allergies as of 04/09/2022       Reactions   Penicillins Other (See Comments)   Does not remember what happens-childhood allergy. Has tolerated amoxicillin in the past, tolerates Keflex        Medication List     STOP taking these medications    HYDROmorphone 2 MG tablet Commonly known as: Dilaudid       TAKE these medications    cephALEXin 500 MG capsule Commonly known as: KEFLEX Take 1 capsule (500 mg total) by mouth 2 (two) times daily.   diazepam 10 MG tablet Commonly known as: VALIUM Take 1 tablet (10 mg total) by mouth every 12 (twelve) hours as needed for anxiety.   lidocaine-prilocaine cream Commonly known as: EMLA Apply 1 application. topically as needed.   loperamide 2 MG capsule Commonly known as: IMODIUM Take 4 mg by mouth daily as needed for diarrhea or loose  stools.   methadone 5 MG tablet Commonly known as: DOLOPHINE Take 1 tablet (5 mg total) by mouth every 12 (twelve) hours.   metroNIDAZOLE 500 MG tablet Commonly known as: Flagyl Take 1 tablet (500 mg total) by mouth 2 (two) times daily.   oxybutynin 5 MG tablet Commonly known as: DITROPAN Take 5 mg by mouth every 8 (eight) hours as needed for bladder spasms.   polyethylene glycol 17 g packet Commonly known as: MIRALAX / GLYCOLAX Take 17 g by mouth daily as needed for mild constipation.   sodium chloride flush 0.9 % Soln Commonly known as: NS 10-40 mLs by Intracatheter route as needed (flush).   Normal Saline Flush 0.9 % Soln Inject 10-40 mls by intracatheter route as needed   tamsulosin 0.4 MG Caps capsule Commonly known as: FLOMAX Take  0.4 mg by mouth daily.        Follow-up Information     [name], DO Follow up in 1 week(s).   Specialty: Family Medicine Contact information: [address] [place] [address] [phone]                Allergies  Allergen Reactions   Penicillins Other (See Comments)    Does not remember what happens-childhood allergy. Has tolerated amoxicillin in the past, tolerates Keflex    Consultations: Urology   Procedures/Studies: DG Chest 2 View  Result Date: 04/07/2022 CLINICAL DATA:  Hyponatremia, anasarca EXAM: CHEST - 2 VIEW COMPARISON:  Chest radiograph 11/13/2018 FINDINGS: There is a right chest wall port in place with the tip in the lower SVC. The cardiomediastinal silhouette is normal. There is minimal bibasilar subsegmental atelectasis. There is no focal consolidation or pulmonary edema. There is no pleural effusion or pneumothorax There is no acute osseous abnormality. IMPRESSION: Minimal bibasilar subsegmental atelectasis. Otherwise, no radiographic evidence of acute cardiopulmonary process. Electronically Signed   By: Valetta Mole M.D.   On: 04/07/2022 15:05   CT ABDOMEN PELVIS W  CONTRAST  Result Date: 03/30/2022 CLINICAL DATA:  Left lower quadrant abdominal pain and diarrhea. History of bladder cancer. EXAM: CT ABDOMEN AND PELVIS WITH CONTRAST TECHNIQUE: Multidetector CT imaging of the abdomen and pelvis was performed using the standard protocol following bolus administration of intravenous contrast. RADIATION DOSE REDUCTION: This exam was performed according to the departmental dose-optimization program which includes automated exposure control, adjustment of the mA and/or kV according to patient size and/or use of iterative reconstruction technique. CONTRAST:  128m OMNIPAQUE IOHEXOL 300 MG/ML  SOLN COMPARISON:  02/24/2022 FINDINGS: Lower chest: Lung bases are clear. Hepatobiliary: Multiple subcentimeter low-attenuation lesions demonstrated throughout the liver, too small to characterize. No change since prior study. Gallbladder is normal. Mild intrahepatic bile duct dilatation. Pancreas: Subcentimeter cystic lesion in the head of the pancreas without change since prior study. No pancreatic ductal dilatation or inflammatory change. Spleen: Normal in size without focal abnormality. Adrenals/Urinary Tract: No adrenal gland nodules. Kidneys are symmetrical. No hydronephrosis or hydroureter. Asymmetrical thickening of the anterior bladder wall with extension into the surrounding tissues corresponding to known bladder tumor. There appears to be a fistula with adjacent bowel. Tiny amount of air in the bladder. Bladder diverticula posteriorly. Stomach/Bowel: Mildly distended fluid-filled small bowel with transition zone in the right lower quadrant. Colon is decompressed with scattered stool. Rectosigmoid wall is thickened, possibly due to under distention or colitis. Appendix is normal. Vascular/Lymphatic: Calcification of the aorta. No aneurysm. No significant retroperitoneal lymphadenopathy. Moderately prominent lymph nodes in the pelvic iliac chains bilaterally are likely metastatic. No  change. Reproductive: Prostate gland is enlarged, measuring 5.6 cm in diameter. Other: Numerous peritoneal and omental low-attenuation nodules demonstrated throughout the abdomen. Largest lesions measure up to about 2.9 cm diameter. No change since prior study. Musculoskeletal: No acute or significant osseous findings. IMPRESSION: 1. Anterior bladder mass consistent with known cancer. The mass extends into the surrounding soft tissues with an apparent fistula to adjacent bowel. 2. Fluid-filled mildly dilated small bowel possibly representing obstruction or ileus. The colon is decompressed. Wall thickening of the rectosigmoid colon could be due to under distention or colitis. 3. Multiple omental nodules and pelvic lymphadenopathy consistent with metastatic disease. 4. Mild intrahepatic bile duct dilatation, new since prior study. Cause is indeterminate. 5. Multiple low-attenuation lesions throughout the liver, measuring less than 1 cm. Lesions are too small to  characterize but likely represent cysts. No change. 6. Aortic atherosclerosis. 7. Prostate gland is enlarged. 8. Unchanged appearance of 1 cm cystic lesion in the head of the pancreas. Electronically Signed   By: Lucienne Capers M.D.   On: 03/30/2022 00:26     Discharge Exam: Vitals:   04/09/22 0500 04/09/22 0800  BP: (!) 120/49   Pulse: 71   Resp: 12   Temp: 98.2 F (36.8 C) 98.3 F (36.8 C)  SpO2: 91%    Vitals:   04/09/22 0000 04/09/22 0400 04/09/22 0500 04/09/22 0800  BP: (!) 114/59 (!) 121/55 (!) 120/49   Pulse: 76 77 71   Resp: '11 12 12   '$ Temp: 99.5 F (37.5 C) 99.2 F (37.3 C) 98.2 F (36.8 C) 98.3 F (36.8 C)  TempSrc: Oral Oral  Oral  SpO2: 91% 94% 91%     General: Pt is alert, awake, not in acute distress Cardiovascular: RRR, S1/S2 +, no rubs, no gallops Respiratory: CTA bilaterally, no wheezing, no rhonchi Abdominal: Soft, NT, ND, bowel sounds + Extremities: no edema, no cyanosis    The results of significant  diagnostics from this hospitalization (including imaging, microbiology, ancillary and laboratory) are listed below for reference.     Microbiology: Recent Results (from the past 240 hour(s))  MRSA Next Gen by PCR, Nasal     Status: None   Collection Time: 04/07/22  6:04 PM   Specimen: Nasal Mucosa; Nasal Swab  Result Value Ref Range Status   MRSA by PCR Next Gen NOT DETECTED NOT DETECTED Final    Comment: (NOTE) The GeneXpert MRSA Assay (FDA approved for NASAL specimens only), is one component of a comprehensive MRSA colonization surveillance program. It is not intended to diagnose MRSA infection nor to guide or monitor treatment for MRSA infections. Test performance is not FDA approved in patients less than 23 years old. Performed at [facility], [place] [address], [place], [place] [address]      Labs: BNP (last 3 results) Recent Labs    04/07/22 1257  BNP 35.4   Basic Metabolic Panel: Recent Labs  Lab 04/07/22 1257 04/07/22 1413 04/08/22 0429 04/08/22 0934 04/08/22 1300 04/08/22 1755 04/08/22 2028 04/09/22 0026 04/09/22 0500  NA 117*   < > 127*   < > 130* 134* 133* 132* 133*  K 3.3*  --  3.4*  --   --   --   --   --  3.7  CL 80*  --  93*  --   --   --   --   --  100  CO2 24  --  24  --   --   --   --   --  26  GLUCOSE 95  --  69*  --   --   --   --   --  85  BUN 19  --  20  --   --   --   --   --  21  CREATININE 0.91  --  0.83  --   --   --   --   --  1.05  CALCIUM 8.0*  --  8.2*  --   --   --   --   --  8.6*   < > = values in this interval not displayed.   Liver Function Tests: Recent Labs  Lab 04/07/22 1257  AST 25  ALT 18  ALKPHOS 49  BILITOT 0.9  PROT 6.3*  ALBUMIN 3.7  Recent Labs  Lab 04/07/22 1257  LIPASE 25   No results for input(s): "AMMONIA" in the last 168 hours. CBC: Recent Labs  Lab 04/07/22 1257 04/08/22 0429  WBC 5.9 5.0  NEUTROABS 4.4  --   HGB 10.2* 9.2*  HCT 29.0* 25.8*  MCV 84.8 85.1  PLT 238 217    Cardiac Enzymes: No results for input(s): "CKTOTAL", "CKMB", "CKMBINDEX", "TROPONINI" in the last 168 hours. BNP: Invalid input(s): "POCBNP" CBG: Recent Labs  Lab 04/07/22 1920  GLUCAP 88   D-Dimer No results for input(s): "DDIMER" in the last 72 hours. Hgb A1c No results for input(s): "HGBA1C" in the last 72 hours. Lipid Profile No results for input(s): "CHOL", "HDL", "LDLCALC", "TRIG", "CHOLHDL", "LDLDIRECT" in the last 72 hours. Thyroid function studies Recent Labs    04/07/22 1838  TSH 0.214*   Anemia work up No results for input(s): "VITAMINB12", "FOLATE", "FERRITIN", "TIBC", "IRON", "RETICCTPCT" in the last 72 hours. Urinalysis    Component Value Date/Time   COLORURINE YELLOW 04/07/2022 1604   APPEARANCEUR CLOUDY (A) 04/07/2022 1604   LABSPEC 1.003 (L) 04/07/2022 1604   PHURINE 5.0 04/07/2022 1604   GLUCOSEU NEGATIVE 04/07/2022 1604   HGBUR LARGE (A) 04/07/2022 1604   BILIRUBINUR NEGATIVE 04/07/2022 1604   KETONESUR NEGATIVE 04/07/2022 1604   PROTEINUR NEGATIVE 04/07/2022 1604   NITRITE NEGATIVE 04/07/2022 1604   LEUKOCYTESUR MODERATE (A) 04/07/2022 1604   Sepsis Labs Recent Labs  Lab 04/07/22 1257 04/08/22 0429  WBC 5.9 5.0   Microbiology Recent Results (from the past 240 hour(s))  MRSA Next Gen by PCR, Nasal     Status: None   Collection Time: 04/07/22  6:04 PM   Specimen: Nasal Mucosa; Nasal Swab  Result Value Ref Range Status   MRSA by PCR Next Gen NOT DETECTED NOT DETECTED Final    Comment: (NOTE) The GeneXpert MRSA Assay (FDA approved for NASAL specimens only), is one component of a comprehensive MRSA colonization surveillance program. It is not intended to diagnose MRSA infection nor to guide or monitor treatment for MRSA infections. Test performance is not FDA approved in patients less than 24 years old. Performed at [facility], [place] [address], [place], [place] [address]      Time coordinating discharge: Over 30  minutes  SIGNED:   Darliss Cheney, MD  [facility] 04/09/2022, 9:55 AM *Please note that this is a verbal dictation therefore any spelling or grammatical errors are due to the "[employer]" system interpretation. If 7PM-7AM, please contact night-coverage [email or web address]

## 2022-04-09 NOTE — Progress Notes (Signed)
IV consult placed to de-access port, educating patient and wife about irrigation of patients Foley catheter. All questions have been answered.

## 2022-04-09 NOTE — TOC Transition Note (Signed)
Transition of Care [facility]) - CM/SW Discharge Note  Patient Details  Name: Jonathan Ray MRN: [id number] Date of Birth: [birth year]  [facility] of [facility] CM/SW Contact:  Sherie Don, LCSW Phone Number: 04/09/2022, 12:33 PM  Clinical Narrative: [facility] consulted for HHPT/OT. Orders are in. CSW made [facility] referral to Amy with [facility], which was accepted. CSW updated patient regarding HH. TOC signing off.  Final next level of care: [place] Barriers to Discharge: Barriers Resolved  Patient Goals and CMS Choice Patient states their goals for this hospitalization and ongoing recovery are:: Discharge home CMS Medicare.gov Compare Post Acute Care list provided to:: Patient Choice offered to / list presented to : Patient  Discharge Plan and Services       DME Arranged: N/A DME Agency: NA HH Arranged: PT, OT HH Agency: [place] Date [facility] Agency Contacted: 04/09/22 Time [place]: 1218 Representative spoke with at [place]: Amy  Readmission Risk Interventions     No data to display

## 2022-04-12 ENCOUNTER — Telehealth: Payer: Self-pay

## 2022-04-12 DIAGNOSIS — D630 Anemia in neoplastic disease: Secondary | ICD-10-CM | POA: Diagnosis not present

## 2022-04-12 DIAGNOSIS — J449 Chronic obstructive pulmonary disease, unspecified: Secondary | ICD-10-CM | POA: Diagnosis not present

## 2022-04-12 DIAGNOSIS — Z8547 Personal history of malignant neoplasm of testis: Secondary | ICD-10-CM | POA: Diagnosis not present

## 2022-04-12 DIAGNOSIS — Z87440 Personal history of urinary (tract) infections: Secondary | ICD-10-CM | POA: Diagnosis not present

## 2022-04-12 DIAGNOSIS — Z466 Encounter for fitting and adjustment of urinary device: Secondary | ICD-10-CM | POA: Diagnosis not present

## 2022-04-12 DIAGNOSIS — N321 Vesicointestinal fistula: Secondary | ICD-10-CM | POA: Diagnosis not present

## 2022-04-12 DIAGNOSIS — R600 Localized edema: Secondary | ICD-10-CM | POA: Diagnosis not present

## 2022-04-12 DIAGNOSIS — K219 Gastro-esophageal reflux disease without esophagitis: Secondary | ICD-10-CM | POA: Diagnosis not present

## 2022-04-12 DIAGNOSIS — Z6823 Body mass index (BMI) 23.0-23.9, adult: Secondary | ICD-10-CM | POA: Diagnosis not present

## 2022-04-12 DIAGNOSIS — C786 Secondary malignant neoplasm of retroperitoneum and peritoneum: Secondary | ICD-10-CM | POA: Diagnosis not present

## 2022-04-12 DIAGNOSIS — R410 Disorientation, unspecified: Secondary | ICD-10-CM | POA: Diagnosis not present

## 2022-04-12 DIAGNOSIS — C679 Malignant neoplasm of bladder, unspecified: Secondary | ICD-10-CM | POA: Diagnosis not present

## 2022-04-12 NOTE — Telephone Encounter (Signed)
Pt wife called informing this RN that pt and wife have called in hospice. This RN answered pt wife questions regarding hospice services and the differences between hospice services and palliative services. No further needs, all questions answered.,

## 2022-04-16 ENCOUNTER — Other Ambulatory Visit: Payer: 59

## 2022-04-16 ENCOUNTER — Ambulatory Visit: Payer: 59

## 2022-04-16 ENCOUNTER — Ambulatory Visit: Payer: 59 | Admitting: Oncology

## 2022-04-20 DIAGNOSIS — R531 Weakness: Secondary | ICD-10-CM | POA: Diagnosis not present

## 2022-05-03 ENCOUNTER — Other Ambulatory Visit: Payer: Self-pay

## 2022-05-11 DEATH — deceased

## 2022-06-02 ENCOUNTER — Encounter (HOSPITAL_COMMUNITY): Payer: Self-pay

## 2023-05-19 IMAGING — PT NM PET TUM IMG INITIAL (PI) SKULL BASE T - THIGH
7 series · 25 of 25 positions shown · non-contrast
Comparison: CT scan 02/01/2020 and MRI pelvis 12/12/2021

CLINICAL DATA: Subsequent treatment strategy for bladder cancer.
Initial diagnosis 4740 history of partial cystectomy.

EXAM:
NUCLEAR MEDICINE PET SKULL BASE TO THIGH
TECHNIQUE: 9.48 mCi F-18 FDG was injected intravenously. Full-ring PET imaging
was performed from the skull base to thigh after the radiotracer. CT
data was obtained and used for attenuation correction and anatomic
localization.
Fasting blood glucose: 141 mg/dl

[Series 3: pet sk_thigh ac · axial · 5.0mm · 4.07mm/px · z∈[-950,-6]mm · 6 of 237 slices shown]
[im 1/237]
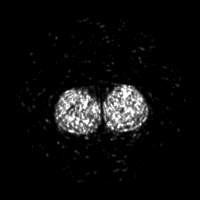
[im 48/237]
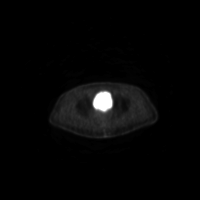
[im 95/237]
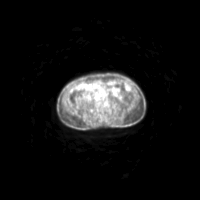
[im 142/237]
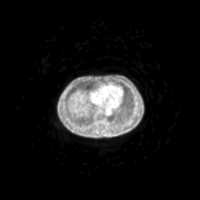
[im 189/237]
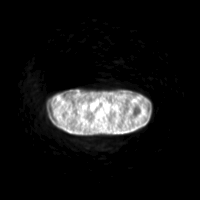
[im 237/237]
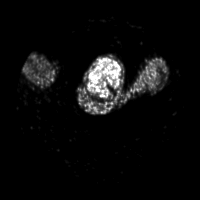

[Series 4: ct sk_thigh 5.0 bf37 · axial · 5.0mm · 0.98mm/px · z∈[-950,-6]mm · 5 of 237 slices shown]
[im 1/237]
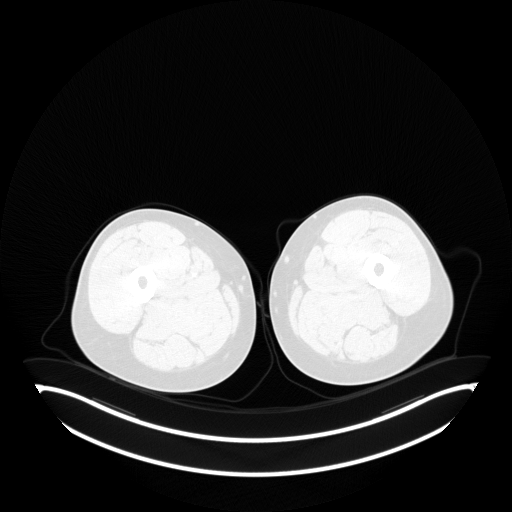
[im 60/237]
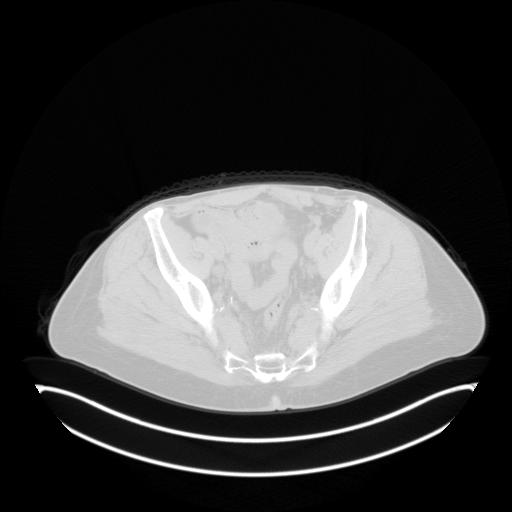
[im 119/237]
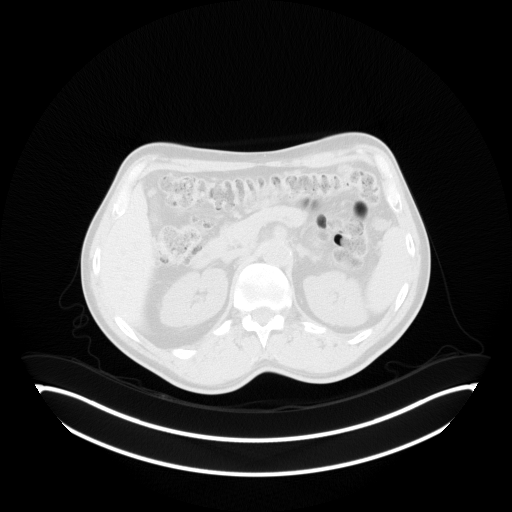
[im 178/237]
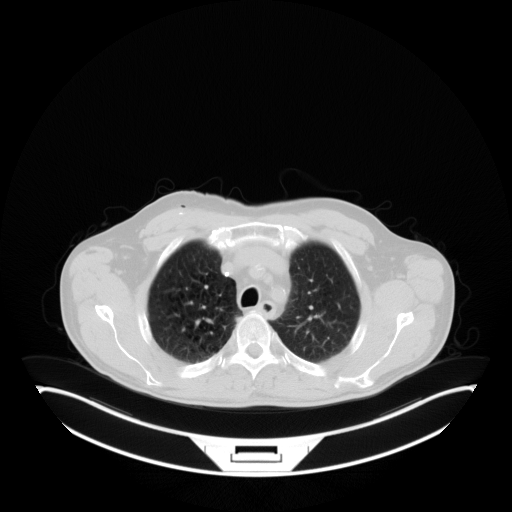
[im 237/237  brain]
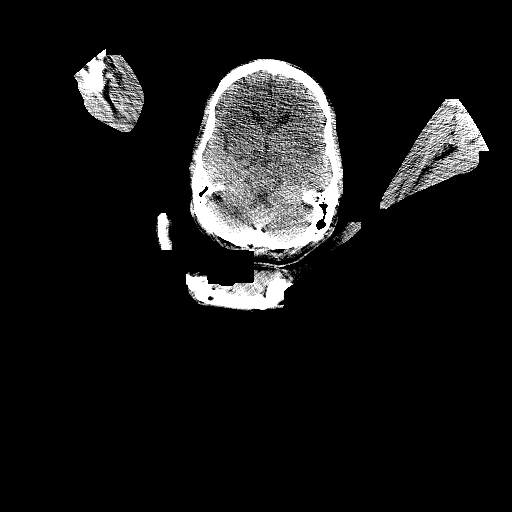

[Series 5: pet sk_thigh nac · axial · 5.0mm · 4.07mm/px · z∈[-950,-6]mm · 5 of 237 slices shown]
[im 1/237]
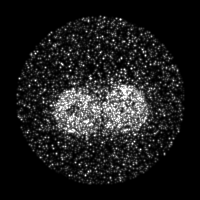
[im 60/237]
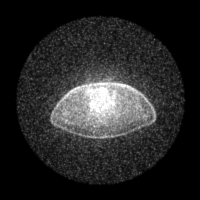
[im 119/237]
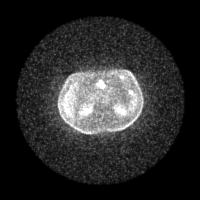
[im 178/237]
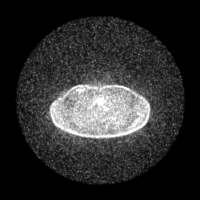
[im 237/237]
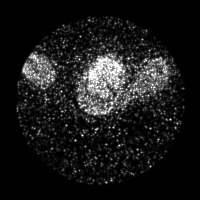

[Series 8: ct sk_thigh 5.0 br59 lung_bone · axial · 5.0mm · 0.67mm/px · z∈[-446,-162]mm · 2 of 72 slices shown]
[im 1/72]
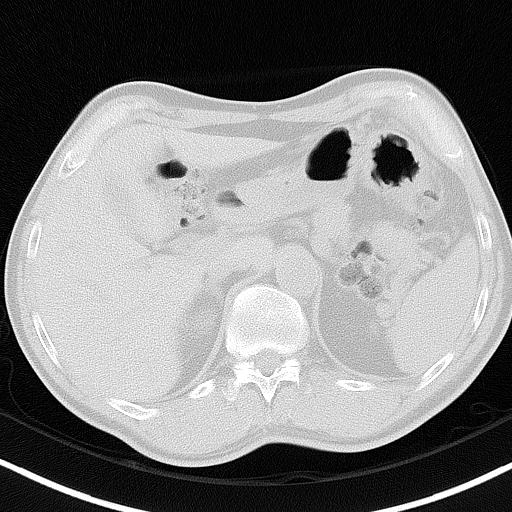
[im 72/72]
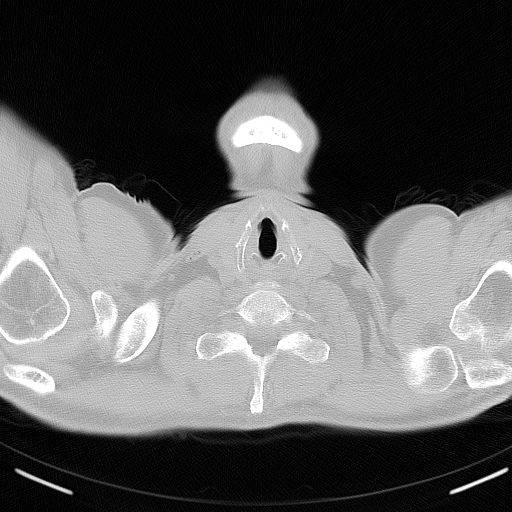

[Series 604: fused cor · 1 of 64 slices shown]
[im 1/64]
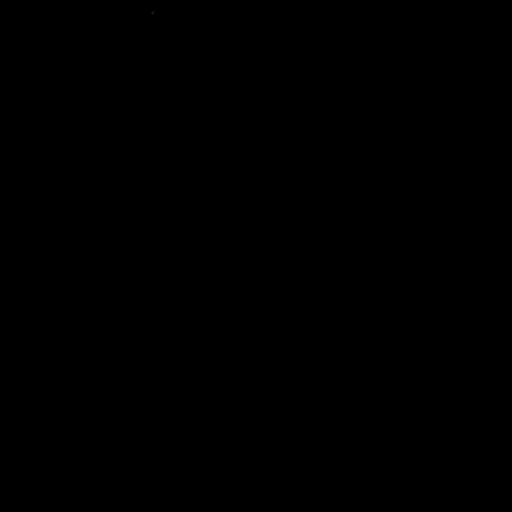

[Series 605: <mip collection> · coronal · 1.96mm/px · 1 of 32 slices shown]
[im 1/32]
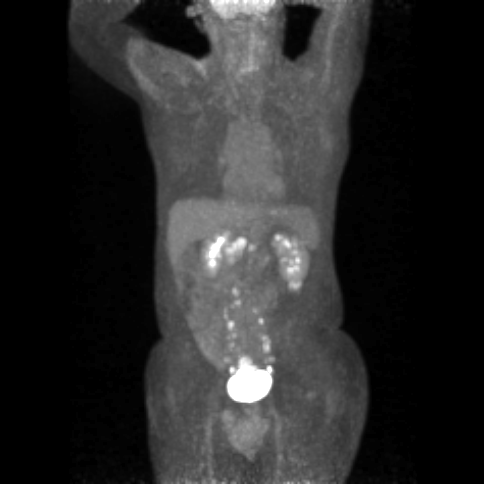

[Series 606: range-ac ct sk_thigh 5.0 hd_fov-tra-<alpha range> · 5 of 231 slices shown]
[im 1/231]
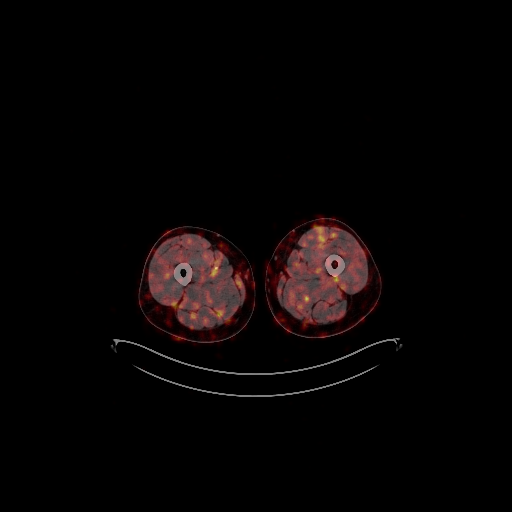
[im 58/231]
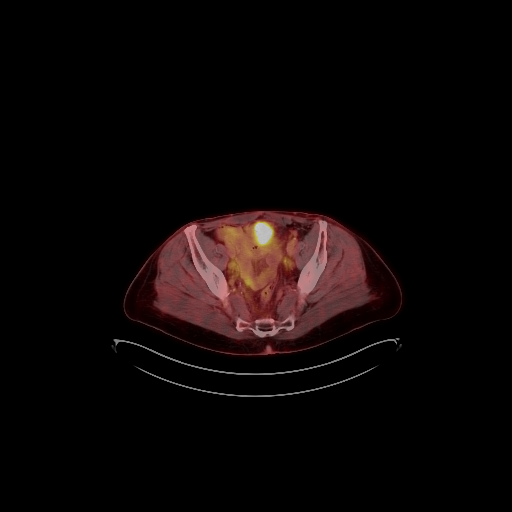
[im 116/231]
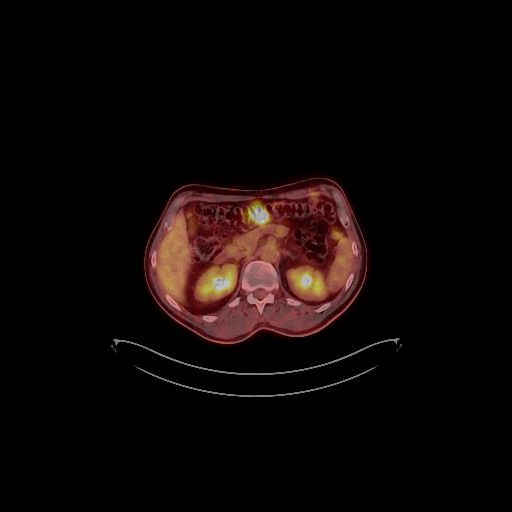
[im 173/231]
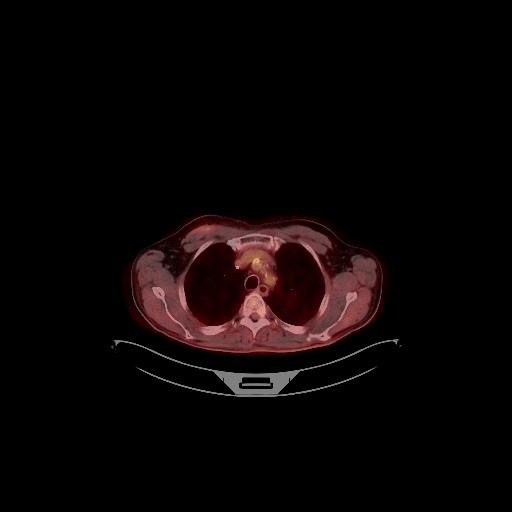
[im 231/231]
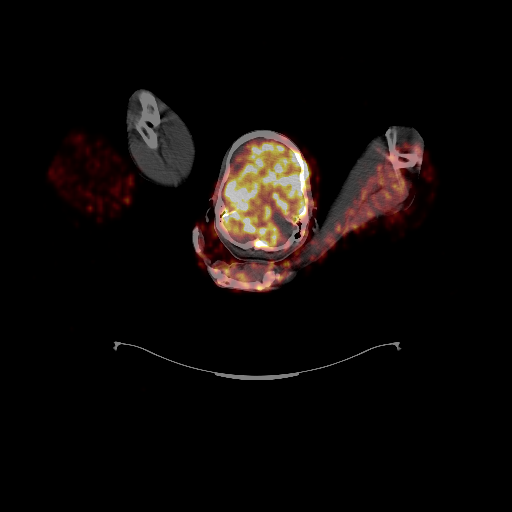

[25 of 25 positions shown; findings below may reference images not displayed]

FINDINGS: Mediastinal blood pool activity: SUV max

Liver activity: SUV max NA

NECK: No hypermetabolic lymph nodes in the neck.

Incidental CT findings: Bilateral carotid artery calcifications are
noted.

CHEST: No hypermetabolic mediastinal or hilar nodes. No suspicious
pulmonary nodules on the CT scan.

No hypermetabolic chest wall mass, supraclavicular or axillary
adenopathy.

Incidental CT findings: Advanced emphysematous changes. No acute
pulmonary findings or worrisome pulmonary lesions or nodules. The
Port-A-Cath is in good position without complicating features. Age
advanced aortic and coronary artery calcifications.

ABDOMEN/PELVIS: There are several hypermetabolic omental lesions
consistent with peritoneal carcinomatosis. In the right greater
omentum on image number 126/4 there is a 17 mm nodule which has an
SUV max of 8.0.

Cluster of 4 omental nodules in the midline 10.14.

Left-sided omental nodule on image 141/4 measures 2.6 cm and SUV max
is 6.36.

Several other smaller omental nodules are noted.

Small hypermetabolic pelvic sidewall lymph nodes consistent with
and the SUV max is 4.10. The largest right-sided node on image 178/4
measures 10 mm and the SUV max is 4.42.

Large irregular bulky mass at the dome region the bladder appears to
extend into the adjacent fat. This is consistent with hypermetabolic
tumor. SUV max is 12.13.

No findings for metastatic disease involving the liver adrenal
glands or spleen. The pancreas and kidneys are unremarkable.

Incidental CT findings: Small scattered hepatic cysts are noted.
Stable age advanced atherosclerotic calcifications involving the
aorta and branch vessels. No aneurysm.

SKELETON: No findings suspicious for osseous metastatic disease.

Incidental CT findings: none
IMPRESSION: 1. Large bulky hypermetabolic tumor involving the dome of the
bladder with findings suspicious for extension outside the bladder
and into the adjacent pericystic fat.
2. Hypermetabolic pelvic nodes consistent with metastatic disease.
3. Numerous hypermetabolic omental implants consistent with
peritoneal carcinomatosis.
4. No findings for metastatic disease involving the chest, solid
abdominal organs or bony structures.
